# Patient Record
Sex: Female | Born: 1970 | Race: White | Hispanic: No | Marital: Married | Smoking: Former smoker
Health system: Southern US, Community
[De-identification: ages and names within clinical notes are randomized; demographics above are authoritative.]

## PROBLEM LIST (undated history)

## (undated) DIAGNOSIS — Z87440 Personal history of urinary (tract) infections: Secondary | ICD-10-CM

## (undated) DIAGNOSIS — G459 Transient cerebral ischemic attack, unspecified: Secondary | ICD-10-CM

## (undated) HISTORY — DX: Personal history of urinary (tract) infections: Z87.440

## (undated) HISTORY — DX: Transient cerebral ischemic attack, unspecified: G45.9

## (undated) HISTORY — PX: ASD REPAIR: SHX258

## (undated) HISTORY — PX: PATENT FORAMEN OVALE CLOSURE: SHX2181

---
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
## 1998-08-03 ENCOUNTER — Other Ambulatory Visit: Admission: RE | Admit: 1998-08-03 | Discharge: 1998-08-03 | Payer: Self-pay | Admitting: Obstetrics and Gynecology

## 1999-12-28 ENCOUNTER — Other Ambulatory Visit: Admission: RE | Admit: 1999-12-28 | Discharge: 1999-12-28 | Payer: Self-pay | Admitting: Obstetrics and Gynecology

## 2002-05-30 ENCOUNTER — Other Ambulatory Visit: Admission: RE | Admit: 2002-05-30 | Discharge: 2002-05-30 | Payer: Self-pay | Admitting: Obstetrics and Gynecology

## 2004-12-10 ENCOUNTER — Emergency Department: Payer: Self-pay | Admitting: Internal Medicine

## 2005-03-10 ENCOUNTER — Ambulatory Visit: Payer: Self-pay | Admitting: Internal Medicine

## 2005-03-18 ENCOUNTER — Ambulatory Visit: Payer: Self-pay | Admitting: Cardiology

## 2005-03-18 ENCOUNTER — Ambulatory Visit: Payer: Self-pay | Admitting: Internal Medicine

## 2005-03-25 ENCOUNTER — Ambulatory Visit: Payer: Self-pay | Admitting: Internal Medicine

## 2005-04-02 ENCOUNTER — Ambulatory Visit: Payer: Self-pay | Admitting: Internal Medicine

## 2005-04-06 ENCOUNTER — Ambulatory Visit: Payer: Self-pay | Admitting: Cardiology

## 2005-04-12 ENCOUNTER — Ambulatory Visit: Payer: Self-pay | Admitting: Cardiology

## 2005-04-19 ENCOUNTER — Ambulatory Visit: Payer: Self-pay | Admitting: Cardiology

## 2005-05-01 ENCOUNTER — Ambulatory Visit: Payer: Self-pay | Admitting: Cardiology

## 2005-05-17 ENCOUNTER — Ambulatory Visit: Payer: Self-pay | Admitting: Cardiology

## 2005-06-17 ENCOUNTER — Ambulatory Visit: Payer: Self-pay | Admitting: Cardiology

## 2005-07-15 ENCOUNTER — Ambulatory Visit: Payer: Self-pay | Admitting: Cardiology

## 2005-08-25 ENCOUNTER — Ambulatory Visit: Payer: Self-pay | Admitting: General Practice

## 2006-03-10 ENCOUNTER — Ambulatory Visit: Payer: Self-pay | Admitting: Cardiology

## 2012-10-25 ENCOUNTER — Emergency Department: Payer: Self-pay | Admitting: Emergency Medicine

## 2014-05-22 ENCOUNTER — Other Ambulatory Visit: Payer: Self-pay | Admitting: Obstetrics and Gynecology

## 2014-05-23 ENCOUNTER — Other Ambulatory Visit: Payer: Self-pay | Admitting: Obstetrics and Gynecology

## 2014-05-23 DIAGNOSIS — R928 Other abnormal and inconclusive findings on diagnostic imaging of breast: Secondary | ICD-10-CM

## 2014-06-02 ENCOUNTER — Ambulatory Visit
Admission: RE | Admit: 2014-06-02 | Discharge: 2014-06-02 | Disposition: A | Discharge status: Home / Self Care | Payer: BC Managed Care – PPO | Source: Ambulatory Visit | Attending: Obstetrics and Gynecology | Admitting: Obstetrics and Gynecology

## 2014-06-02 DIAGNOSIS — R928 Other abnormal and inconclusive findings on diagnostic imaging of breast: Secondary | ICD-10-CM

## 2014-09-12 HISTORY — PX: POLYPECTOMY: SHX149

## 2014-12-16 ENCOUNTER — Encounter: Payer: Self-pay | Admitting: *Deleted

## 2014-12-16 ENCOUNTER — Other Ambulatory Visit: Payer: Self-pay | Admitting: Obstetrics and Gynecology

## 2014-12-16 DIAGNOSIS — R921 Mammographic calcification found on diagnostic imaging of breast: Secondary | ICD-10-CM

## 2014-12-23 ENCOUNTER — Other Ambulatory Visit: Payer: Self-pay | Admitting: Obstetrics and Gynecology

## 2014-12-23 ENCOUNTER — Ambulatory Visit
Admission: RE | Admit: 2014-12-23 | Discharge: 2014-12-23 | Disposition: A | Discharge status: Home / Self Care | Payer: 59 | Source: Ambulatory Visit | Attending: Obstetrics and Gynecology | Admitting: Obstetrics and Gynecology

## 2014-12-23 DIAGNOSIS — N6489 Other specified disorders of breast: Secondary | ICD-10-CM

## 2014-12-23 DIAGNOSIS — R921 Mammographic calcification found on diagnostic imaging of breast: Secondary | ICD-10-CM

## 2015-03-26 ENCOUNTER — Encounter (INDEPENDENT_AMBULATORY_CARE_PROVIDER_SITE_OTHER): Payer: Self-pay

## 2015-03-26 ENCOUNTER — Encounter: Payer: Self-pay | Admitting: Primary Care

## 2015-03-26 ENCOUNTER — Ambulatory Visit (INDEPENDENT_AMBULATORY_CARE_PROVIDER_SITE_OTHER): Payer: 59 | Admitting: Primary Care

## 2015-03-26 VITALS — BP 128/82 | HR 82 | Temp 98.6°F | Ht 64.75 in | Wt 167.8 lb

## 2015-03-26 DIAGNOSIS — N393 Stress incontinence (female) (male): Secondary | ICD-10-CM | POA: Diagnosis not present

## 2015-03-26 NOTE — Assessment & Plan Note (Signed)
Mild. Intermittent. Present since age 44 since birth of child.  Does not interfere with daily life.

## 2015-03-26 NOTE — Progress Notes (Signed)
Pre visit review using our clinic review tool, if applicable. No additional management support is needed unless otherwise documented below in the visit note. 

## 2015-03-26 NOTE — Progress Notes (Signed)
   Subjective:    Patient ID: Angela Snow, female    DOB: [birth year], 44 y.o.   MRN: [id number]  HPI  Angela Snow is a 44 year old female who presents today to establish care and discuss the problems mentioned below. Will obtain old records.  1) Urine incontinency: Present since age 84. Intermittent. Stress type that occurs with sneezing, jumping. Does not impair daily life.  2) TIA: Atrial eptal defect that was discovered in 2006 and repaired later that year. Denies chest pain, shortness of breath, headaches.  Review of Systems  Eyes: Negative for visual disturbance.  Respiratory: Negative for shortness of breath.   Cardiovascular: Negative for chest pain.  Genitourinary: Negative for difficulty urinating.  Neurological: Negative for dizziness and headaches.       Past Medical History  Diagnosis Date  . History of UTI   . TIA (transient ischemic attack)     atrial septal defect    History   Social History  . Marital Status: Married    Spouse Name: N/A  . Number of Children: N/A  . Years of Education: N/A   Occupational History  . Not on file.   Social History Main Topics  . Smoking status: Never Smoker   . Smokeless tobacco: Not on file  . Alcohol Use: No  . Drug Use: Not on file  . Sexual Activity: Not on file   Other Topics Concern  . Not on file   Social History Narrative   Married.   2 children.   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   Enjoys spending time with family.       Past Surgical History  Procedure Laterality Date  . Patent foramen ovale closure    . Asd repair    . Bladder surgery  1976  . Kidney surgery  1976  . Polypectomy  2016    uterine    Family History  Problem Relation Age of Onset  . Colon cancer Maternal Grandfather   . Hypertension Father   . Hyperlipidemia Father   . Hyperlipidemia Mother     Allergies  Allergen Reactions  . Penicillins Rash    No current outpatient prescriptions on  file prior to visit.   No current facility-administered medications on file prior to visit.    BP 128/82 mmHg  Pulse 82  Temp(Src) 98.6 F (37 C) (Oral)  Ht 5' 4.75" (1.645 m)  Wt 167 lb 12.8 oz (76.114 kg)  BMI 28.13 kg/m2  SpO2 97%  LMP 03/05/2015    Objective:   Physical Exam  Constitutional: She is oriented to person, place, and time. She appears well-nourished.  Cardiovascular: Normal rate and regular rhythm.   Pulmonary/Chest: Effort normal and breath sounds normal.  Neurological: She is alert and oriented to person, place, and time.  Skin: Skin is warm and dry.          Assessment & Plan:

## 2015-03-26 NOTE — Patient Instructions (Signed)
Please schedule a physical with me in the next 3 months. You will also schedule a lab only appointment one week prior. We will discuss your lab results during your physical.  It was a pleasure to meet you today! Please don't hesitate to call me with any questions. Welcome to [facility]!

## 2015-06-11 ENCOUNTER — Other Ambulatory Visit: Payer: Self-pay | Admitting: Primary Care

## 2015-06-11 DIAGNOSIS — Z0000 Encounter for general adult medical examination without abnormal findings: Secondary | ICD-10-CM

## 2015-06-19 ENCOUNTER — Other Ambulatory Visit (INDEPENDENT_AMBULATORY_CARE_PROVIDER_SITE_OTHER): Payer: 59

## 2015-06-19 DIAGNOSIS — Z0000 Encounter for general adult medical examination without abnormal findings: Secondary | ICD-10-CM

## 2015-06-19 LAB — COMPREHENSIVE METABOLIC PANEL
ALK PHOS: 50 U/L (ref 39–117)
ALT: 8 U/L (ref 0–35)
AST: 11 U/L (ref 0–37)
Albumin: 3.9 g/dL (ref 3.5–5.2)
BUN: 12 mg/dL (ref 6–23)
CO2: 29 meq/L (ref 19–32)
Calcium: 8.7 mg/dL (ref 8.4–10.5)
Chloride: 104 mEq/L (ref 96–112)
Creatinine, Ser: 0.73 mg/dL (ref 0.40–1.20)
GFR: 92.16 mL/min (ref >60.00)
GLUCOSE: 97 mg/dL (ref 70–99)
POTASSIUM: 3.9 meq/L (ref 3.5–5.1)
SODIUM: 139 meq/L (ref 135–145)
TOTAL PROTEIN: 6.8 g/dL (ref 6.0–8.3)
Total Bilirubin: 0.3 mg/dL (ref 0.2–1.2)

## 2015-06-19 LAB — LIPID PANEL
Cholesterol: 199 mg/dL (ref 0–200)
HDL: 70.9 mg/dL (ref >39.00)
LDL CALC: 118 mg/dL — AB (ref 0–99)
NONHDL: 128.29
Total CHOL/HDL Ratio: 3
Triglycerides: 53 mg/dL (ref 0.0–149.0)
VLDL: 10.6 mg/dL (ref 0.0–40.0)

## 2015-06-19 LAB — TSH: TSH: 1.65 u[IU]/mL (ref 0.35–4.50)

## 2015-06-19 LAB — VITAMIN D 25 HYDROXY (VIT D DEFICIENCY, FRACTURES): VITD: 29.75 ng/mL — AB (ref 30.00–100.00)

## 2015-06-26 ENCOUNTER — Encounter: Payer: Self-pay | Admitting: Primary Care

## 2015-06-26 ENCOUNTER — Ambulatory Visit (INDEPENDENT_AMBULATORY_CARE_PROVIDER_SITE_OTHER): Payer: 59 | Admitting: Primary Care

## 2015-06-26 ENCOUNTER — Encounter: Payer: 59 | Admitting: Primary Care

## 2015-06-26 VITALS — BP 116/70 | HR 87 | Temp 98.6°F | Ht 65.0 in | Wt 168.8 lb

## 2015-06-26 DIAGNOSIS — E559 Vitamin D deficiency, unspecified: Secondary | ICD-10-CM | POA: Diagnosis not present

## 2015-06-26 DIAGNOSIS — Z0000 Encounter for general adult medical examination without abnormal findings: Secondary | ICD-10-CM | POA: Diagnosis not present

## 2015-06-26 NOTE — Patient Instructions (Signed)
Start taking Vitamin D 2000 units daily. This may be purchased over the counter.  Start exercising. You should be getting 1 hour of moderate intensity exercise 5 days weekly.  Limit fast food, fried foods, and sweets.  Follow up in 1 year for repeat physical.  It was a pleasure to see you today!

## 2015-06-26 NOTE — Assessment & Plan Note (Signed)
Tetanus and flu UTD. Exam unremarkable. Labs with low vitamin D levels, otherwise unremarkable. Discussed healthy diet and exercise, limit fast food, fried foods, start exercising. Follow up in 1 year for repeat physical.

## 2015-06-26 NOTE — Progress Notes (Signed)
Pre visit review using our clinic review tool, if applicable. No additional management support is needed unless otherwise documented below in the visit note. 

## 2015-06-26 NOTE — Assessment & Plan Note (Signed)
Level of 29. Start 2000 units OTC daily.

## 2015-06-26 NOTE — Progress Notes (Signed)
Subjective:    Patient ID: Angela Snow, female    DOB: January 10, 1971, 44 y.o.   MRN: [id number]  HPI  Angela Snow is a 44 year old female who presents today for complete physical.  Immunizations: -Tetanus: Completed in 2007. -Influenza: Completed at work this season.   Diet: She endorses a poor diet. Breakfast: Yogurt and fruit, smoothie Lunch: [place] sandwich, cheeseburger fries Dinner: Fast food, sometimes home cooked meals with meat and veggies Snacks: Ice cream, chips, candy bars Beverages: Water, coffee with milk Exercise: Active at work but does not routinely exercise. Eye exam: Completed years ago. Denies changes in vision. Dental exam: Completed in July Pap Smear: Completed 1 year ago. Mammogram: Completed 1 year ago. Dense breast tissue. Due now, she will call and schedule.   Review of Systems  HENT: Negative for rhinorrhea.   Respiratory: Negative for cough and shortness of breath.   Cardiovascular: Negative for chest pain.  Gastrointestinal: Negative for diarrhea and constipation.  Genitourinary: Negative for difficulty urinating.       Regular periods  Musculoskeletal: Negative for myalgias and arthralgias.  Skin: Negative for rash.  Allergic/Immunologic: Negative for environmental allergies.  Neurological: Negative for dizziness, numbness and headaches.  Psychiatric/Behavioral:       Denies concerns for anxiety or depression       Past Medical History  Diagnosis Date  . History of UTI   . TIA (transient ischemic attack)     atrial septal defect    Social History   Social History  . Marital Status: Married    Spouse Name: N/A  . Number of Children: N/A  . Years of Education: N/A   Occupational History  . Not on file.   Social History Main Topics  . Smoking status: Never Smoker   . Smokeless tobacco: Not on file  . Alcohol Use: No  . Drug Use: Not on file  . Sexual Activity: Not on file   Other Topics Concern  . Not on file   Social  History Narrative   Married.   2 children.   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   Enjoys spending time with family.       Past Surgical History  Procedure Laterality Date  . Patent foramen ovale closure    . Asd repair    . Bladder surgery  1976  . Kidney surgery  1976  . Polypectomy  2016    uterine    Family History  Problem Relation Age of Onset  . Colon cancer Maternal Grandfather   . Hypertension Father   . Hyperlipidemia Father   . Hyperlipidemia Mother     Allergies  Allergen Reactions  . Penicillins Rash    No current outpatient prescriptions on file prior to visit.   No current facility-administered medications on file prior to visit.    BP 116/70 mmHg  Pulse 87  Temp(Src) 98.6 F (37 C) (Oral)  Ht 5\' 5"  (1.651 m)  Wt 168 lb 12.8 oz (76.567 kg)  BMI 28.09 kg/m2  SpO2 97%    Objective:   Physical Exam  Constitutional: She is oriented to person, place, and time. She appears well-nourished.  HENT:  Right Ear: Tympanic membrane and ear canal normal.  Left Ear: Tympanic membrane and ear canal normal.  Nose: Nose normal.  Mouth/Throat: Oropharynx is clear and moist.  Eyes: Conjunctivae and EOM are normal. Pupils are equal, round, and reactive to light.  Neck: Neck supple. No  thyromegaly present.  Cardiovascular: Normal rate and regular rhythm.   Pulmonary/Chest: Effort normal and breath sounds normal.  Abdominal: Soft. Bowel sounds are normal. There is no tenderness.  Musculoskeletal: Normal range of motion.  Lymphadenopathy:    She has no cervical adenopathy.  Neurological: She is alert and oriented to person, place, and time. She has normal reflexes. No cranial nerve deficit.  Skin: Skin is warm and dry.  Psychiatric: She has a normal mood and affect.          Assessment & Plan:

## 2016-04-25 ENCOUNTER — Ambulatory Visit: Payer: Self-pay | Admitting: Physician Assistant

## 2016-04-25 ENCOUNTER — Telehealth: Payer: 59 | Admitting: Family Medicine

## 2016-04-25 DIAGNOSIS — J0120 Acute ethmoidal sinusitis, unspecified: Secondary | ICD-10-CM

## 2016-04-25 MED ORDER — AZITHROMYCIN 250 MG PO TABS: ORAL_TABLET | ORAL | 0 refills | Status: DC

## 2016-04-25 NOTE — Progress Notes (Signed)
We are sorry that you are not feeling well.  Here is how we plan to help!  Based on what you have shared with me it looks like you have sinusitis.  Sinusitis is inflammation and infection in the sinus cavities of the head.  Based on your presentation I believe you most likely have Acute Viral Sinusitis.This is an infection most likely caused by a virus. There is not specific treatment for viral sinusitis other than to help you with the symptoms until the infection runs its course.  You may use an oral decongestant such as Mucinex D or if you have glaucoma or high blood pressure use plain Mucinex. Saline nasal spray help and can safely be used as often as needed for congestion, I have prescribed: Z pack. Take as directed. Increase daily fluids  Some authorities believe that zinc sprays or the use of Echinacea may shorten the course of your symptoms.  Sinus infections are not as easily transmitted as other respiratory infection, however we still recommend that you avoid close contact with loved ones, especially the very young and elderly.  Remember to wash your hands thoroughly throughout the day as this is the number one way to prevent the spread of infection!  Home Care:  Only take medications as instructed by your medical team.  Complete the entire course of an antibiotic.  Do not take these medications with alcohol.  A steam or ultrasonic humidifier can help congestion.  You can place a towel over your head and breathe in the steam from hot water coming from a faucet.  Avoid close contacts especially the very young and the elderly.  Cover your mouth when you cough or sneeze.  Always remember to wash your hands.  Get Help Right Away If:  You develop worsening fever or sinus pain.  You develop a severe head ache or visual changes.  Your symptoms persist after you have completed your treatment plan.  Make sure you  Understand these instructions.  Will watch your condition.  Will  get help right away if you are not doing well or get worse.  Your e-visit answers were reviewed by a board certified advanced clinical practitioner to complete your personal care plan.  Depending on the condition, your plan could have included both over the counter or prescription medications.  If there is a problem please reply  once you have received a response from your provider.  Your safety is important to [place].  If you have drug allergies check your prescription carefully.    You can use MyChart to ask questions about today's visit, request a non-urgent call back, or ask for a work or school excuse for 24 hours related to this e-Visit. If it has been greater than 24 hours you will need to follow up with your provider, or enter a new e-Visit to address those concerns.  You will get an e-mail in the next two days asking about your experience.  I hope that your e-visit has been valuable and will speed your recovery. Thank you for using e-visits.

## 2016-05-13 ENCOUNTER — Other Ambulatory Visit: Payer: Self-pay | Admitting: Obstetrics and Gynecology

## 2016-05-13 DIAGNOSIS — Z1231 Encounter for screening mammogram for malignant neoplasm of breast: Secondary | ICD-10-CM

## 2016-05-31 ENCOUNTER — Ambulatory Visit
Admission: RE | Admit: 2016-05-31 | Discharge: 2016-05-31 | Disposition: A | Discharge status: Home / Self Care | Payer: 59 | Source: Ambulatory Visit | Attending: Obstetrics and Gynecology | Admitting: Obstetrics and Gynecology

## 2016-05-31 DIAGNOSIS — Z1231 Encounter for screening mammogram for malignant neoplasm of breast: Secondary | ICD-10-CM | POA: Diagnosis not present

## 2016-06-28 ENCOUNTER — Encounter: Payer: Self-pay | Admitting: Primary Care

## 2016-06-28 ENCOUNTER — Ambulatory Visit (INDEPENDENT_AMBULATORY_CARE_PROVIDER_SITE_OTHER): Payer: 59 | Admitting: Primary Care

## 2016-06-28 VITALS — BP 102/77 | HR 77 | Ht 64.75 in | Wt 157.0 lb

## 2016-06-28 DIAGNOSIS — Z23 Encounter for immunization: Secondary | ICD-10-CM

## 2016-06-28 DIAGNOSIS — Z0000 Encounter for general adult medical examination without abnormal findings: Secondary | ICD-10-CM

## 2016-06-28 DIAGNOSIS — E559 Vitamin D deficiency, unspecified: Secondary | ICD-10-CM | POA: Diagnosis not present

## 2016-06-28 LAB — LIPID PANEL
CHOL/HDL RATIO: 3
Cholesterol: 218 mg/dL — ABNORMAL HIGH (ref 0–200)
HDL: 73.5 mg/dL (ref >39.00)
LDL CALC: 133 mg/dL — AB (ref 0–99)
NonHDL: 144.32
TRIGLYCERIDES: 56 mg/dL (ref 0.0–149.0)
VLDL: 11.2 mg/dL (ref 0.0–40.0)

## 2016-06-28 LAB — COMPREHENSIVE METABOLIC PANEL
ALT: 9 U/L (ref 0–35)
AST: 13 U/L (ref 0–37)
Albumin: 4.1 g/dL (ref 3.5–5.2)
Alkaline Phosphatase: 59 U/L (ref 39–117)
BUN: 15 mg/dL (ref 6–23)
CO2: 29 meq/L (ref 19–32)
CREATININE: 0.73 mg/dL (ref 0.40–1.20)
Calcium: 9.2 mg/dL (ref 8.4–10.5)
Chloride: 106 mEq/L (ref 96–112)
GFR: 91.72 mL/min (ref >60.00)
Glucose, Bld: 92 mg/dL (ref 70–99)
POTASSIUM: 3.8 meq/L (ref 3.5–5.1)
Sodium: 141 mEq/L (ref 135–145)
Total Bilirubin: 0.4 mg/dL (ref 0.2–1.2)
Total Protein: 7.1 g/dL (ref 6.0–8.3)

## 2016-06-28 LAB — VITAMIN D 25 HYDROXY (VIT D DEFICIENCY, FRACTURES): VITD: 33.8 ng/mL (ref 30.00–100.00)

## 2016-06-28 NOTE — Assessment & Plan Note (Signed)
Has not started Vitamin D. Recheck levels today.

## 2016-06-28 NOTE — Patient Instructions (Signed)
Complete lab work prior to leaving today. I will notify you of your results once received.   You were provided with a tetanus vaccination which will cover you for 10 years.   Continue your efforts towards a healthy diet. Avoid the drive through and junk food at work, it's just not worth it!  Start exercising. You should be getting 150 minutes of moderate intensity exercise weekly.  Ensure you are consuming 64 ounces of water daily.  Follow up in 1 year for repeat physical or sooner if needed.  It was a pleasure to see you today!

## 2016-06-28 NOTE — Assessment & Plan Note (Signed)
Tetanus due, provided today. Influenza UTD. Pap UTD, follows with GYN. Discussed the importance of a healthy diet and regular exercise in order for weight loss, and to reduce the risk of other medical diseases. Commended her on her weight loss, encouraged her to continue. Exam unremarkable. Labs pending. Follow up in 1 year for repeat physical.

## 2016-06-28 NOTE — Progress Notes (Signed)
Subjective:    Patient ID: Angela Snow, female    DOB: May 12, 1971, 45 y.o.   MRN: [id number]  HPI  Angela Snow is a 45 year old female who presents today for complete physical.  Immunizations: -Tetanus: Completed in 2007, due. -Influenza: Completed September 2017   Diet: She endorses a fair diet.  Breakfast: Yogurt, banana, peanut butter Lunch: Sandwich, salad, fruit Dinner: Fast food, salads, soups Snacks: Yogurt Desserts: Several times weekly Beverages: Water, coffee, occasional sodas  Exercise: She does not currently exercise Eye exam: Completed in 2017 Dental exam: Completes semi-annually Pap Smear: UTD, follows with GYN Mammogram: Completed in September 2017.  Wt Readings from Last 3 Encounters:  06/28/16 157 lb (71.2 kg)  06/26/15 168 lb 12.8 oz (76.6 kg)  03/26/15 167 lb 12.8 oz (76.1 kg)     Review of Systems  Constitutional: Negative for unexpected weight change.  HENT: Negative for rhinorrhea.   Respiratory: Negative for cough and shortness of breath.   Cardiovascular: Negative for chest pain.  Gastrointestinal: Negative for constipation and diarrhea.  Genitourinary: Negative for difficulty urinating and menstrual problem.  Musculoskeletal: Negative for arthralgias and myalgias.  Skin: Negative for rash.  Allergic/Immunologic: Negative for environmental allergies.  Neurological: Negative for dizziness, numbness and headaches.  Psychiatric/Behavioral:       Denies concerns for anxiety or depression.       Past Medical History:  Diagnosis Date  . History of UTI   . TIA (transient ischemic attack)    atrial septal defect     Social History   Social History  . Marital status: Married    Spouse name: N/A  . Number of children: N/A  . Years of education: N/A   Occupational History  . Not on file.   Social History Main Topics  . Smoking status: Never Smoker  . Smokeless tobacco: Not on file  . Alcohol use No  . Drug use: Unknown  .  Sexual activity: Not on file   Other Topics Concern  . Not on file   Social History Narrative   Married.   2 children.   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   Enjoys spending time with family.       Past Surgical History:  Procedure Laterality Date  . ASD REPAIR    . BLADDER SURGERY  1976  . KIDNEY SURGERY  1976  . PATENT FORAMEN OVALE CLOSURE    . POLYPECTOMY  2016   uterine    Family History  Problem Relation Age of Onset  . Colon cancer Maternal Grandfather   . Hypertension Father   . Hyperlipidemia Father   . Hyperlipidemia Mother     Allergies  Allergen Reactions  . Penicillins Rash    No current outpatient prescriptions on file prior to visit.   No current facility-administered medications on file prior to visit.     BP 102/77   Pulse 77   Ht 5' 4.75" (1.645 m)   Wt 157 lb (71.2 kg)   LMP 05/11/2016 Comment: IRREGULAR  SpO2 98%   BMI 26.33 kg/m    Objective:   Physical Exam  Constitutional: She is oriented to person, place, and time. She appears well-nourished.  HENT:  Right Ear: Tympanic membrane and ear canal normal.  Left Ear: Tympanic membrane and ear canal normal.  Nose: Nose normal.  Mouth/Throat: Oropharynx is clear and moist.  Eyes: Conjunctivae and EOM are normal. Pupils are equal, round, and reactive to  light.  Neck: Neck supple. No thyromegaly present.  Cardiovascular: Normal rate and regular rhythm.   No murmur heard. Pulmonary/Chest: Effort normal and breath sounds normal. She has no rales.  Abdominal: Soft. Bowel sounds are normal. There is no tenderness.  Musculoskeletal: Normal range of motion.  Lymphadenopathy:    She has no cervical adenopathy.  Neurological: She is alert and oriented to person, place, and time. She has normal reflexes. No cranial nerve deficit.  Skin: Skin is warm and dry. No rash noted.  Psychiatric: She has a normal mood and affect.          Assessment & Plan:

## 2016-06-28 NOTE — Addendum Note (Signed)
Addended by: Inocencio Homes on: 06/28/2016 08:55 AM   Modules accepted: Orders

## 2016-07-13 DIAGNOSIS — Z130 Encounter for screening for diseases of the blood and blood-forming organs and certain disorders involving the immune mechanism: Secondary | ICD-10-CM | POA: Diagnosis not present

## 2016-07-13 DIAGNOSIS — Z01419 Encounter for gynecological examination (general) (routine) without abnormal findings: Secondary | ICD-10-CM | POA: Diagnosis not present

## 2016-07-13 DIAGNOSIS — Z6827 Body mass index (BMI) 27.0-27.9, adult: Secondary | ICD-10-CM | POA: Diagnosis not present

## 2016-07-13 DIAGNOSIS — H5213 Myopia, bilateral: Secondary | ICD-10-CM | POA: Diagnosis not present

## 2016-07-13 DIAGNOSIS — Z1389 Encounter for screening for other disorder: Secondary | ICD-10-CM | POA: Diagnosis not present

## 2016-07-13 DIAGNOSIS — N911 Secondary amenorrhea: Secondary | ICD-10-CM | POA: Diagnosis not present

## 2017-05-09 ENCOUNTER — Other Ambulatory Visit: Payer: Self-pay | Admitting: Obstetrics and Gynecology

## 2017-05-09 DIAGNOSIS — Z1231 Encounter for screening mammogram for malignant neoplasm of breast: Secondary | ICD-10-CM

## 2017-06-01 ENCOUNTER — Ambulatory Visit
Admission: RE | Admit: 2017-06-01 | Discharge: 2017-06-01 | Disposition: A | Discharge status: Home / Self Care | Payer: 59 | Source: Ambulatory Visit | Attending: Obstetrics and Gynecology | Admitting: Obstetrics and Gynecology

## 2017-06-01 DIAGNOSIS — Z1231 Encounter for screening mammogram for malignant neoplasm of breast: Secondary | ICD-10-CM

## 2017-06-30 ENCOUNTER — Encounter: Payer: Self-pay | Admitting: Primary Care

## 2017-07-05 ENCOUNTER — Encounter: Payer: Self-pay | Admitting: Primary Care

## 2017-07-05 ENCOUNTER — Ambulatory Visit (INDEPENDENT_AMBULATORY_CARE_PROVIDER_SITE_OTHER): Payer: 59 | Admitting: Primary Care

## 2017-07-05 VITALS — BP 118/72 | HR 78 | Temp 98.6°F | Ht 64.75 in | Wt 178.1 lb

## 2017-07-05 DIAGNOSIS — Z0000 Encounter for general adult medical examination without abnormal findings: Secondary | ICD-10-CM

## 2017-07-05 DIAGNOSIS — E559 Vitamin D deficiency, unspecified: Secondary | ICD-10-CM | POA: Diagnosis not present

## 2017-07-05 DIAGNOSIS — N393 Stress incontinence (female) (male): Secondary | ICD-10-CM | POA: Diagnosis not present

## 2017-07-05 LAB — LIPID PANEL
CHOLESTEROL: 224 mg/dL — AB (ref 0–200)
HDL: 76.9 mg/dL (ref >39.00)
LDL Cholesterol: 138 mg/dL — ABNORMAL HIGH (ref 0–99)
NONHDL: 146.74
TRIGLYCERIDES: 46 mg/dL (ref 0.0–149.0)
Total CHOL/HDL Ratio: 3
VLDL: 9.2 mg/dL (ref 0.0–40.0)

## 2017-07-05 LAB — COMPREHENSIVE METABOLIC PANEL
ALK PHOS: 66 U/L (ref 39–117)
ALT: 11 U/L (ref 0–35)
AST: 15 U/L (ref 0–37)
Albumin: 4.2 g/dL (ref 3.5–5.2)
BILIRUBIN TOTAL: 0.4 mg/dL (ref 0.2–1.2)
BUN: 16 mg/dL (ref 6–23)
CALCIUM: 9.6 mg/dL (ref 8.4–10.5)
CO2: 29 mEq/L (ref 19–32)
Chloride: 103 mEq/L (ref 96–112)
Creatinine, Ser: 0.78 mg/dL (ref 0.40–1.20)
GFR: 84.59 mL/min (ref >60.00)
GLUCOSE: 94 mg/dL (ref 70–99)
POTASSIUM: 4.1 meq/L (ref 3.5–5.1)
SODIUM: 139 meq/L (ref 135–145)
Total Protein: 7 g/dL (ref 6.0–8.3)

## 2017-07-05 LAB — HEMOGLOBIN A1C: Hgb A1c MFr Bld: 5.4 % (ref 4.6–6.5)

## 2017-07-05 LAB — VITAMIN D 25 HYDROXY (VIT D DEFICIENCY, FRACTURES): VITD: 25.03 ng/mL — ABNORMAL LOW (ref 30.00–100.00)

## 2017-07-05 NOTE — Progress Notes (Signed)
Subjective:    Patient ID: Angela Snow, female    DOB: 08-Jan-1971, 46 y.o.   MRN: [id number]  HPI  Angela Snow is a 46 year old female who presents today for complete physical.  Immunizations: -Tetanus: Completed in 2017 -Influenza: Completed this season   Diet: She endorses a poor diet. Breakfast: Yogurt, fruit Lunch: Fast food, restaurants Dinner: [employer], restaurants, pasta   Snacks: Candy Desserts: Daily Beverages: Water, coffee  Exercise: She recently started walking 30 minutes daily. Eye exam: Completed one year ago. Dental exam: Completes semi-annually Pap Smear: UTD following with GYN. Mammogram: Completed in September 2018   Review of Systems  Constitutional: Negative for unexpected weight change.  HENT: Negative for rhinorrhea.   Respiratory: Negative for cough and shortness of breath.   Cardiovascular: Negative for chest pain.  Gastrointestinal: Negative for constipation and diarrhea.  Genitourinary: Negative for difficulty urinating.  Musculoskeletal: Negative for arthralgias and myalgias.  Skin: Negative for rash.  Allergic/Immunologic: Negative for environmental allergies.  Neurological: Negative for dizziness, numbness and headaches.  Psychiatric/Behavioral:       Denies concerns for anxiety and depression       Past Medical History:  Diagnosis Date  . History of UTI   . TIA (transient ischemic attack)    atrial septal defect     Social History   Social History  . Marital status: Married    Spouse name: N/A  . Number of children: N/A  . Years of education: N/A   Occupational History  . Not on file.   Social History Main Topics  . Smoking status: Never Smoker  . Smokeless tobacco: Never Used  . Alcohol use No  . Drug use: Unknown  . Sexual activity: Not on file   Other Topics Concern  . Not on file   Social History Narrative   Married.   2 children.   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   Enjoys  spending time with family.       Past Surgical History:  Procedure Laterality Date  . ASD REPAIR    . BLADDER SURGERY  1976  . KIDNEY SURGERY  1976  . PATENT FORAMEN OVALE CLOSURE    . POLYPECTOMY  2016   uterine    Family History  Problem Relation Age of Onset  . Colon cancer Maternal Grandfather   . Hypertension Father   . Hyperlipidemia Father   . Hyperlipidemia Mother   . Breast cancer Neg Hx     Allergies  Allergen Reactions  . Penicillins Rash    No current outpatient prescriptions on file prior to visit.   No current facility-administered medications on file prior to visit.     BP 118/72   Pulse 78   Temp 98.6 F (37 C) (Oral)   Ht 5' 4.75" (1.645 m)   Wt 178 lb 1.9 oz (80.8 kg)   LMP 06/17/2017   SpO2 98%   BMI 29.87 kg/m    Objective:   Physical Exam  Constitutional: She is oriented to person, place, and time. She appears well-nourished.  HENT:  Right Ear: Tympanic membrane and ear canal normal.  Left Ear: Tympanic membrane and ear canal normal.  Nose: Nose normal.  Mouth/Throat: Oropharynx is clear and moist.  Eyes: Pupils are equal, round, and reactive to light. Conjunctivae and EOM are normal.  Neck: Neck supple. No thyromegaly present.  Cardiovascular: Normal rate and regular rhythm.   No murmur heard. Pulmonary/Chest: Effort  normal and breath sounds normal. She has no rales.  Abdominal: Soft. Bowel sounds are normal. There is no tenderness.  Musculoskeletal: Normal range of motion.  Lymphadenopathy:    She has no cervical adenopathy.  Neurological: She is alert and oriented to person, place, and time. She has normal reflexes. No cranial nerve deficit.  Skin: Skin is warm and dry. No rash noted.  Psychiatric: She has a normal mood and affect.          Assessment & Plan:

## 2017-07-05 NOTE — Assessment & Plan Note (Signed)
About the same, slightly worse since weight gain.  Overall manageable.

## 2017-07-05 NOTE — Assessment & Plan Note (Signed)
Immunizations UTD. Pap and Mammogram UTD, following with GYN. Discussed the importance of a healthy diet and regular exercise in order for weight loss, and to reduce the risk of other medical problems. Exam unremarkable. Labs pending. Follow up in 1 year.

## 2017-07-05 NOTE — Patient Instructions (Signed)
Complete lab work prior to leaving today. I will notify you of your results once received.   Continue exercising. You should be getting 150 minutes of moderate intensity exercise weekly.  It's important to improve your diet by reducing consumption of fast food, fried food, processed snack foods, sugary drinks. Increase consumption of fresh vegetables and fruits, whole grains, water.  Ensure you are drinking 64 ounces of water daily.  Follow up in 1 year for your annual exam or sooner if needed.  It was a pleasure to see you today!

## 2017-07-05 NOTE — Assessment & Plan Note (Signed)
Labs pending today.  

## 2017-07-12 ENCOUNTER — Encounter: Payer: Self-pay | Admitting: Primary Care

## 2017-07-28 DIAGNOSIS — Z124 Encounter for screening for malignant neoplasm of cervix: Secondary | ICD-10-CM | POA: Diagnosis not present

## 2017-07-28 DIAGNOSIS — Z1151 Encounter for screening for human papillomavirus (HPV): Secondary | ICD-10-CM | POA: Diagnosis not present

## 2017-07-28 DIAGNOSIS — Z01419 Encounter for gynecological examination (general) (routine) without abnormal findings: Secondary | ICD-10-CM | POA: Diagnosis not present

## 2017-07-28 DIAGNOSIS — Z1389 Encounter for screening for other disorder: Secondary | ICD-10-CM | POA: Diagnosis not present

## 2017-07-28 DIAGNOSIS — N393 Stress incontinence (female) (male): Secondary | ICD-10-CM | POA: Diagnosis not present

## 2017-07-28 DIAGNOSIS — Z130 Encounter for screening for diseases of the blood and blood-forming organs and certain disorders involving the immune mechanism: Secondary | ICD-10-CM | POA: Diagnosis not present

## 2017-11-03 DIAGNOSIS — N6019 Diffuse cystic mastopathy of unspecified breast: Secondary | ICD-10-CM | POA: Diagnosis not present

## 2018-05-03 ENCOUNTER — Other Ambulatory Visit: Payer: Self-pay | Admitting: Obstetrics and Gynecology

## 2018-05-03 DIAGNOSIS — Z1231 Encounter for screening mammogram for malignant neoplasm of breast: Secondary | ICD-10-CM

## 2018-06-14 ENCOUNTER — Ambulatory Visit
Admission: RE | Admit: 2018-06-14 | Discharge: 2018-06-14 | Disposition: A | Discharge status: Home / Self Care | Payer: 59 | Source: Ambulatory Visit | Attending: Obstetrics and Gynecology | Admitting: Obstetrics and Gynecology

## 2018-06-14 DIAGNOSIS — Z1231 Encounter for screening mammogram for malignant neoplasm of breast: Secondary | ICD-10-CM | POA: Diagnosis not present

## 2018-07-09 ENCOUNTER — Other Ambulatory Visit: Payer: Self-pay | Admitting: Primary Care

## 2018-07-09 DIAGNOSIS — E559 Vitamin D deficiency, unspecified: Secondary | ICD-10-CM

## 2018-07-09 DIAGNOSIS — Z0000 Encounter for general adult medical examination without abnormal findings: Secondary | ICD-10-CM

## 2018-07-17 ENCOUNTER — Other Ambulatory Visit (INDEPENDENT_AMBULATORY_CARE_PROVIDER_SITE_OTHER): Payer: 59

## 2018-07-17 DIAGNOSIS — Z0000 Encounter for general adult medical examination without abnormal findings: Secondary | ICD-10-CM

## 2018-07-17 DIAGNOSIS — E559 Vitamin D deficiency, unspecified: Secondary | ICD-10-CM

## 2018-07-17 LAB — LIPID PANEL
CHOL/HDL RATIO: 3
Cholesterol: 223 mg/dL — ABNORMAL HIGH (ref 0–200)
HDL: 75.4 mg/dL (ref >39.00)
LDL Cholesterol: 137 mg/dL — ABNORMAL HIGH (ref 0–99)
NonHDL: 147.33
TRIGLYCERIDES: 53 mg/dL (ref 0.0–149.0)
VLDL: 10.6 mg/dL (ref 0.0–40.0)

## 2018-07-17 LAB — COMPREHENSIVE METABOLIC PANEL
ALT: 13 U/L (ref 0–35)
AST: 15 U/L (ref 0–37)
Albumin: 4.4 g/dL (ref 3.5–5.2)
Alkaline Phosphatase: 67 U/L (ref 39–117)
BUN: 17 mg/dL (ref 6–23)
CO2: 29 meq/L (ref 19–32)
CREATININE: 0.85 mg/dL (ref 0.40–1.20)
Calcium: 9.5 mg/dL (ref 8.4–10.5)
Chloride: 104 mEq/L (ref 96–112)
GFR: 76.25 mL/min (ref >60.00)
Glucose, Bld: 93 mg/dL (ref 70–99)
Potassium: 3.8 mEq/L (ref 3.5–5.1)
Sodium: 141 mEq/L (ref 135–145)
Total Bilirubin: 0.4 mg/dL (ref 0.2–1.2)
Total Protein: 7.3 g/dL (ref 6.0–8.3)

## 2018-07-17 LAB — VITAMIN D 25 HYDROXY (VIT D DEFICIENCY, FRACTURES): VITD: 32.99 ng/mL (ref 30.00–100.00)

## 2018-07-20 ENCOUNTER — Encounter: Payer: Self-pay | Admitting: Primary Care

## 2018-07-20 ENCOUNTER — Ambulatory Visit (INDEPENDENT_AMBULATORY_CARE_PROVIDER_SITE_OTHER): Payer: 59 | Admitting: Primary Care

## 2018-07-20 VITALS — BP 122/72 | HR 78 | Temp 98.2°F | Ht 64.5 in | Wt 184.0 lb

## 2018-07-20 DIAGNOSIS — E559 Vitamin D deficiency, unspecified: Secondary | ICD-10-CM

## 2018-07-20 DIAGNOSIS — Z0000 Encounter for general adult medical examination without abnormal findings: Secondary | ICD-10-CM | POA: Diagnosis not present

## 2018-07-20 NOTE — Assessment & Plan Note (Signed)
Not taking vitamin D, recommended she resume 1000 units. Recent D level normal, on the lower end of normal.

## 2018-07-20 NOTE — Patient Instructions (Signed)
Start vitamin D 1000 units once daily. Also a multivitamin.   Continue exercising. You should be getting 150 minutes of moderate intensity exercise weekly.  It's important to improve your diet by reducing consumption of fast food, fried food, processed snack foods, sugary drinks. Increase consumption of fresh vegetables and fruits, whole grains, water.  Ensure you are drinking 64 ounces of water daily.  We will see you in one year for your annual exam or sooner if needed.  It was a pleasure to see you today!   Preventive Care 40-64 Years, Female Preventive care refers to lifestyle choices and visits with your health care provider that can promote health and wellness. What does preventive care include?  A yearly physical exam. This is also called an annual well check.  Dental exams once or twice a year.  Routine eye exams. Ask your health care provider how often you should have your eyes checked.  Personal lifestyle choices, including: ? Daily care of your teeth and gums. ? Regular physical activity. ? Eating a healthy diet. ? Avoiding tobacco and drug use. ? Limiting alcohol use. ? Practicing safe sex. ? Taking low-dose aspirin daily starting at age 41. ? Taking vitamin and mineral supplements as recommended by your health care provider. What happens during an annual well check? The services and screenings done by your health care provider during your annual well check will depend on your age, overall health, lifestyle risk factors, and family history of disease. Counseling Your health care provider may ask you questions about your:  Alcohol use.  Tobacco use.  Drug use.  Emotional well-being.  Home and relationship well-being.  Sexual activity.  Eating habits.  Work and work Statistician.  Method of birth control.  Menstrual cycle.  Pregnancy history.  Screening You may have the following tests or measurements:  Height, weight, and BMI.  Blood  pressure.  Lipid and cholesterol levels. These may be checked every 5 years, or more frequently if you are over 5 years old.  Skin check.  Lung cancer screening. You may have this screening every year starting at age 46 if you have a 30-pack-year history of smoking and currently smoke or have quit within the past 15 years.  Fecal occult blood test (FOBT) of the stool. You may have this test every year starting at age 60.  Flexible sigmoidoscopy or colonoscopy. You may have a sigmoidoscopy every 5 years or a colonoscopy every 10 years starting at age 62.  Hepatitis C blood test.  Hepatitis B blood test.  Sexually transmitted disease (STD) testing.  Diabetes screening. This is done by checking your blood sugar (glucose) after you have not eaten for a while (fasting). You may have this done every 1-3 years.  Mammogram. This may be done every 1-2 years. Talk to your health care provider about when you should start having regular mammograms. This may depend on whether you have a family history of breast cancer.  BRCA-related cancer screening. This may be done if you have a family history of breast, ovarian, tubal, or peritoneal cancers.  Pelvic exam and Pap test. This may be done every 3 years starting at age 38. Starting at age 5, this may be done every 5 years if you have a Pap test in combination with an HPV test.  Bone density scan. This is done to screen for osteoporosis. You may have this scan if you are at high risk for osteoporosis.  Discuss your test results, treatment options, and if  necessary, the need for more tests with your health care provider. Vaccines Your health care provider may recommend certain vaccines, such as:  Influenza vaccine. This is recommended every year.  Tetanus, diphtheria, and acellular pertussis (Tdap, Td) vaccine. You may need a Td booster every 10 years.  Varicella vaccine. You may need this if you have not been vaccinated.  Zoster vaccine. You  may need this after age 110.  Measles, mumps, and rubella (MMR) vaccine. You may need at least one dose of MMR if you were born in 1957 or later. You may also need a second dose.  Pneumococcal 13-valent conjugate (PCV13) vaccine. You may need this if you have certain conditions and were not previously vaccinated.  Pneumococcal polysaccharide (PPSV23) vaccine. You may need one or two doses if you smoke cigarettes or if you have certain conditions.  Meningococcal vaccine. You may need this if you have certain conditions.  Hepatitis A vaccine. You may need this if you have certain conditions or if you travel or work in places where you may be exposed to hepatitis A.  Hepatitis B vaccine. You may need this if you have certain conditions or if you travel or work in places where you may be exposed to hepatitis B.  Haemophilus influenzae type b (Hib) vaccine. You may need this if you have certain conditions.  Talk to your health care provider about which screenings and vaccines you need and how often you need them. This information is not intended to replace advice given to you by your health care provider. Make sure you discuss any questions you have with your health care provider. Document Released: 09/25/2015 Document Revised: 05/18/2016 Document Reviewed: 06/30/2015 Elsevier Interactive Patient Education  [employer].

## 2018-07-20 NOTE — Progress Notes (Signed)
Subjective:    Patient ID: Angela Snow, female    DOB: Oct 23, 1970, 47 y.o.   MRN: [id number]  HPI  Angela Snow is a 47 year old female who presents today for complete physical.  Immunizations: -Tetanus: Completed in 2017 -Influenza: Completed this season  Diet: She endorses a poor diet Breakfast: Eggs, bacon, fruit, peanut butter, yogurt Lunch: Salad, fast food (burgers, fries) Dinner: Take out food, occasionally cooks Snacks: Peanut butter, almonds, pudding  Desserts: Daily  Beverages: Water, coffee, occasional soda  Exercise: She working out twice weekly, recently started. Eye exam: No recent exam Dental exam: Completes annually  Pap Smear: Completed in 2018 Mammogram: Completed in 2019   Review of Systems  Constitutional: Negative for unexpected weight change.  HENT: Negative for rhinorrhea.   Respiratory: Negative for cough and shortness of breath.   Cardiovascular: Negative for chest pain.  Gastrointestinal: Negative for constipation and diarrhea.  Genitourinary: Negative for difficulty urinating and menstrual problem.  Musculoskeletal: Negative for arthralgias and myalgias.  Skin: Negative for rash.  Allergic/Immunologic: Negative for environmental allergies.  Neurological: Negative for dizziness, numbness and headaches.  Psychiatric/Behavioral: The patient is not nervous/anxious.        Past Medical History:  Diagnosis Date  . History of UTI   . TIA (transient ischemic attack)    atrial septal defect     Social History   Socioeconomic History  . Marital status: Married    Spouse name: Not on file  . Number of children: Not on file  . Years of education: Not on file  . Highest education level: Not on file  Occupational History  . Not on file  Social Needs  . Financial resource strain: Not on file  . Food insecurity:    Worry: Not on file    Inability: Not on file  . Transportation needs:    Medical: Not on file    Non-medical: Not on file    Tobacco Use  . Smoking status: Never Smoker  . Smokeless tobacco: Never Used  Substance and Sexual Activity  . Alcohol use: No    Alcohol/week: 0.0 standard drinks  . Drug use: Not on file  . Sexual activity: Not on file  Lifestyle  . Physical activity:    Days per week: Not on file    Minutes per session: Not on file  . Stress: Not on file  Relationships  . Social connections:    Talks on phone: Not on file    Gets together: Not on file    Attends religious service: Not on file    Active member of club or organization: Not on file    Attends meetings of clubs or organizations: Not on file    Relationship status: Not on file  . Intimate partner violence:    Fear of current or ex partner: Not on file    Emotionally abused: Not on file    Physically abused: Not on file    Forced sexual activity: Not on file  Other Topics Concern  . Not on file  Social History Narrative   Married.   2 children.   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   Enjoys spending time with family.    Past Surgical History:  Procedure Laterality Date  . ASD REPAIR    . BLADDER SURGERY  1976  . KIDNEY SURGERY  1976  . PATENT FORAMEN OVALE CLOSURE    . POLYPECTOMY  2016   uterine  Family History  Problem Relation Age of Onset  . Colon cancer Maternal Grandfather   . Hypertension Father   . Hyperlipidemia Father   . Hyperlipidemia Mother   . Breast cancer Neg Hx     Allergies  Allergen Reactions  . Penicillins Rash    No current outpatient medications on file prior to visit.   No current facility-administered medications on file prior to visit.     BP 122/72   Pulse 78   Temp 98.2 F (36.8 C) (Oral)   Ht 5' 4.5" (1.638 m)   Wt 184 lb (83.5 kg)   LMP 06/29/2017   SpO2 98%   BMI 31.10 kg/m    Objective:   Physical Exam  Constitutional: She is oriented to person, place, and time. She appears well-nourished.  HENT:  Mouth/Throat: No oropharyngeal  exudate.  Eyes: Pupils are equal, round, and reactive to light. EOM are normal.  Neck: Neck supple. No thyromegaly present.  Cardiovascular: Normal rate and regular rhythm.  Respiratory: Effort normal and breath sounds normal.  GI: Soft. Bowel sounds are normal. There is no tenderness.  Musculoskeletal: Normal range of motion.  Neurological: She is alert and oriented to person, place, and time.  Skin: Skin is warm and dry.  Psychiatric: She has a normal mood and affect.           Assessment & Plan:

## 2018-07-20 NOTE — Assessment & Plan Note (Signed)
Immunizations UTD. Pap smear and mammogram UTD. Recommended to work on diet, encouraged her to continue with regular exercise. Exam unremarkable. Labs reviewed. Follow up in 1 year for CPE.

## 2018-09-17 ENCOUNTER — Telehealth: Payer: 59 | Admitting: Family Medicine

## 2018-09-17 DIAGNOSIS — N390 Urinary tract infection, site not specified: Secondary | ICD-10-CM

## 2018-09-17 MED ORDER — NITROFURANTOIN MONOHYD MACRO 100 MG PO CAPS: 100.0000 mg | ORAL_CAPSULE | Freq: Two times a day (BID) | ORAL | 0 refills | Status: AC

## 2018-09-17 NOTE — Progress Notes (Signed)

## 2019-06-13 ENCOUNTER — Other Ambulatory Visit: Payer: Self-pay | Admitting: Obstetrics and Gynecology

## 2019-06-13 DIAGNOSIS — Z1231 Encounter for screening mammogram for malignant neoplasm of breast: Secondary | ICD-10-CM

## 2019-06-19 ENCOUNTER — Other Ambulatory Visit: Payer: Self-pay | Admitting: *Deleted

## 2019-06-19 DIAGNOSIS — Z20822 Contact with and (suspected) exposure to covid-19: Secondary | ICD-10-CM

## 2019-07-22 ENCOUNTER — Other Ambulatory Visit (INDEPENDENT_AMBULATORY_CARE_PROVIDER_SITE_OTHER): Payer: 59

## 2019-07-22 ENCOUNTER — Encounter: Payer: Self-pay | Admitting: Primary Care

## 2019-07-22 ENCOUNTER — Other Ambulatory Visit: Payer: Self-pay

## 2019-07-22 ENCOUNTER — Ambulatory Visit (INDEPENDENT_AMBULATORY_CARE_PROVIDER_SITE_OTHER): Payer: 59 | Admitting: Primary Care

## 2019-07-22 VITALS — BP 108/74 | HR 79 | Temp 97.2°F | Ht 64.0 in | Wt 181.0 lb

## 2019-07-22 DIAGNOSIS — R7309 Other abnormal glucose: Secondary | ICD-10-CM | POA: Diagnosis not present

## 2019-07-22 DIAGNOSIS — R739 Hyperglycemia, unspecified: Secondary | ICD-10-CM

## 2019-07-22 DIAGNOSIS — E559 Vitamin D deficiency, unspecified: Secondary | ICD-10-CM | POA: Diagnosis not present

## 2019-07-22 DIAGNOSIS — Z0000 Encounter for general adult medical examination without abnormal findings: Secondary | ICD-10-CM | POA: Diagnosis not present

## 2019-07-22 DIAGNOSIS — Z1211 Encounter for screening for malignant neoplasm of colon: Secondary | ICD-10-CM | POA: Diagnosis not present

## 2019-07-22 DIAGNOSIS — N393 Stress incontinence (female) (male): Secondary | ICD-10-CM | POA: Diagnosis not present

## 2019-07-22 LAB — CBC
HCT: 40.6 % (ref 36.0–46.0)
Hemoglobin: 13.8 g/dL (ref 12.0–15.0)
MCHC: 33.9 g/dL (ref 30.0–36.0)
MCV: 83.3 fl (ref 78.0–100.0)
Platelets: 222 10*3/uL (ref 150.0–400.0)
RBC: 4.87 Mil/uL (ref 3.87–5.11)
RDW: 13.1 % (ref 11.5–15.5)
WBC: 4.2 10*3/uL (ref 4.0–10.5)

## 2019-07-22 LAB — LIPID PANEL
Cholesterol: 242 mg/dL — ABNORMAL HIGH (ref 0–200)
HDL: 74.6 mg/dL (ref >39.00)
LDL Cholesterol: 156 mg/dL — ABNORMAL HIGH (ref 0–99)
NonHDL: 167.83
Total CHOL/HDL Ratio: 3
Triglycerides: 60 mg/dL (ref 0.0–149.0)
VLDL: 12 mg/dL (ref 0.0–40.0)

## 2019-07-22 LAB — COMPREHENSIVE METABOLIC PANEL
ALT: 13 U/L (ref 0–35)
AST: 14 U/L (ref 0–37)
Albumin: 4.2 g/dL (ref 3.5–5.2)
Alkaline Phosphatase: 84 U/L (ref 39–117)
BUN: 15 mg/dL (ref 6–23)
CO2: 30 mEq/L (ref 19–32)
Calcium: 9.2 mg/dL (ref 8.4–10.5)
Chloride: 103 mEq/L (ref 96–112)
Creatinine, Ser: 0.83 mg/dL (ref 0.40–1.20)
GFR: 73.42 mL/min (ref >60.00)
Glucose, Bld: 106 mg/dL — ABNORMAL HIGH (ref 70–99)
Potassium: 4.1 mEq/L (ref 3.5–5.1)
Sodium: 140 mEq/L (ref 135–145)
Total Bilirubin: 0.5 mg/dL (ref 0.2–1.2)
Total Protein: 6.6 g/dL (ref 6.0–8.3)

## 2019-07-22 LAB — HEMOGLOBIN A1C: Hgb A1c MFr Bld: 5.6 % (ref 4.6–6.5)

## 2019-07-22 LAB — VITAMIN D 25 HYDROXY (VIT D DEFICIENCY, FRACTURES): VITD: 26.78 ng/mL — ABNORMAL LOW (ref 30.00–100.00)

## 2019-07-22 NOTE — Assessment & Plan Note (Signed)
Not taking vitamin D, repeat level pending.

## 2019-07-22 NOTE — Assessment & Plan Note (Signed)
Immunizations UTD. Pap smear UTD, follows with GYN. Mammogram due, scheduled. Referral placed to GI for colon cancer screening. Discussed the importance of a healthy diet and regular exercise in order for weight loss, and to reduce the risk of any potential medical problems. Exam today unremarkable. Labs pending.

## 2019-07-22 NOTE — Patient Instructions (Signed)
Stop by the lab prior to leaving today. I will notify you of your results once received.   You will be contacted regarding your referral to GI for the colonoscopy.  Please let us know if you have not been contacted within two weeks.   Complete your mammogram as scheduled.  It's important to improve your diet by reducing consumption of fast food, fried food, processed snack foods, sugary drinks. Increase consumption of fresh vegetables and fruits, whole grains, water.  Ensure you are drinking 64 ounces of water daily.  Start exercising. You should be getting 150 minutes of moderate intensity exercise weekly.  It was a pleasure to see you today!   Preventive Care 42-43 Years Old, Female Preventive care refers to visits with your health care provider and lifestyle choices that can promote health and wellness. This includes:  A yearly physical exam. This may also be called an annual well check.  Regular dental visits and eye exams.  Immunizations.  Screening for certain conditions.  Healthy lifestyle choices, such as eating a healthy diet, getting regular exercise, not using drugs or products that contain nicotine and tobacco, and limiting alcohol use. What can I expect for my preventive care visit? Physical exam Your health care provider will check your:  Height and weight. This may be used to calculate body mass index (BMI), which tells if you are at a healthy weight.  Heart rate and blood pressure.  Skin for abnormal spots. Counseling Your health care provider may ask you questions about your:  Alcohol, tobacco, and drug use.  Emotional well-being.  Home and relationship well-being.  Sexual activity.  Eating habits.  Work and work Statistician.  Method of birth control.  Menstrual cycle.  Pregnancy history. What immunizations do I need?  Influenza (flu) vaccine  This is recommended every year. Tetanus, diphtheria, and pertussis (Tdap) vaccine  You may need  a Td booster every 10 years. Varicella (chickenpox) vaccine  You may need this if you have not been vaccinated. Zoster (shingles) vaccine  You may need this after age 35. Measles, mumps, and rubella (MMR) vaccine  You may need at least one dose of MMR if you were born in 1957 or later. You may also need a second dose. Pneumococcal conjugate (PCV13) vaccine  You may need this if you have certain conditions and were not previously vaccinated. Pneumococcal polysaccharide (PPSV23) vaccine  You may need one or two doses if you smoke cigarettes or if you have certain conditions. Meningococcal conjugate (MenACWY) vaccine  You may need this if you have certain conditions. Hepatitis A vaccine  You may need this if you have certain conditions or if you travel or work in places where you may be exposed to hepatitis A. Hepatitis B vaccine  You may need this if you have certain conditions or if you travel or work in places where you may be exposed to hepatitis B. Haemophilus influenzae type b (Hib) vaccine  You may need this if you have certain conditions. Human papillomavirus (HPV) vaccine  If recommended by your health care provider, you may need three doses over 6 months. You may receive vaccines as individual doses or as more than one vaccine together in one shot (combination vaccines). Talk with your health care provider about the risks and benefits of combination vaccines. What tests do I need? Blood tests  Lipid and cholesterol levels. These may be checked every 5 years, or more frequently if you are over 57 years old.  Hepatitis C  test.  Hepatitis B test. Screening  Lung cancer screening. You may have this screening every year starting at age 57 if you have a 30-pack-year history of smoking and currently smoke or have quit within the past 15 years.  Colorectal cancer screening. All adults should have this screening starting at age 95 and continuing until age 62. Your health  care provider may recommend screening at age 18 if you are at increased risk. You will have tests every 1-10 years, depending on your results and the type of screening test.  Diabetes screening. This is done by checking your blood sugar (glucose) after you have not eaten for a while (fasting). You may have this done every 1-3 years.  Mammogram. This may be done every 1-2 years. Talk with your health care provider about when you should start having regular mammograms. This may depend on whether you have a family history of breast cancer.  BRCA-related cancer screening. This may be done if you have a family history of breast, ovarian, tubal, or peritoneal cancers.  Pelvic exam and Pap test. This may be done every 3 years starting at age 90. Starting at age 22, this may be done every 5 years if you have a Pap test in combination with an HPV test. Other tests  Sexually transmitted disease (STD) testing.  Bone density scan. This is done to screen for osteoporosis. You may have this scan if you are at high risk for osteoporosis. Follow these instructions at home: Eating and drinking  Eat a diet that includes fresh fruits and vegetables, whole grains, lean protein, and low-fat dairy.  Take vitamin and mineral supplements as recommended by your health care provider.  Do not drink alcohol if: ? Your health care provider tells you not to drink. ? You are pregnant, may be pregnant, or are planning to become pregnant.  If you drink alcohol: ? Limit how much you have to 0-1 drink a day. ? Be aware of how much alcohol is in your drink. In the [place] one drink equals one 12 oz bottle of beer (355 mL), one 5 oz glass of wine (148 mL), or one 1 oz glass of hard liquor (44 mL). Lifestyle  Take daily care of your teeth and gums.  Stay active. Exercise for at least 30 minutes on 5 or more days each week.  Do not use any products that contain nicotine or tobacco, such as cigarettes, e-cigarettes, and  chewing tobacco. If you need help quitting, ask your health care provider.  If you are sexually active, practice safe sex. Use a condom or other form of birth control (contraception) in order to prevent pregnancy and STIs (sexually transmitted infections).  If told by your health care provider, take low-dose aspirin daily starting at age 62. What's next?  Visit your health care provider once a year for a well check visit.  Ask your health care provider how often you should have your eyes and teeth checked.  Stay up to date on all vaccines. This information is not intended to replace advice given to you by your health care provider. Make sure you discuss any questions you have with your health care provider. Document Released: 09/25/2015 Document Revised: 05/10/2018 Document Reviewed: 05/10/2018 Elsevier Patient Education  2020 [employer].

## 2019-07-22 NOTE — Progress Notes (Signed)
Subjective:    Patient ID: Angela Snow, female    DOB: [birth year], 48 y.o.   MRN: [id number]  HPI  Angela Snow is a 48 year old female who presents today for complete physical.  Immunizations: -Tetanus: Completed in 2017 -Influenza: Completed in September 2020  Diet: She endorses a poor diet. Mostly home cooked meals, take out food, fatty food. She is drinking soda, little water.  Exercise: She is not exercising   Eye exam: Completed over 1 year ago Dental exam: Completes semi-annually   Pap Smear: Completed in 2018, follows with GYN. Mammogram: Scheduled for November 2020 Colonoscopy: Never completed   BP Readings from Last 3 Encounters:  07/20/18 122/72  07/05/17 118/72  06/28/16 102/77     Review of Systems  Constitutional: Negative for unexpected weight change.  HENT: Negative for rhinorrhea.   Respiratory: Negative for cough and shortness of breath.   Cardiovascular: Negative for chest pain.  Gastrointestinal: Negative for constipation and diarrhea.  Genitourinary: Negative for difficulty urinating.  Musculoskeletal: Negative for arthralgias and myalgias.  Skin: Negative for rash.  Allergic/Immunologic: Negative for environmental allergies.  Neurological: Negative for dizziness and headaches.  Psychiatric/Behavioral: The patient is not nervous/anxious.        Past Medical History:  Diagnosis Date  . History of UTI   . TIA (transient ischemic attack)    atrial septal defect     Social History   Socioeconomic History  . Marital status: Married    Spouse name: Not on file  . Number of children: Not on file  . Years of education: Not on file  . Highest education level: Not on file  Occupational History  . Not on file  Social Needs  . Financial resource strain: Not on file  . Food insecurity    Worry: Not on file    Inability: Not on file  . Transportation needs    Medical: Not on file    Non-medical: Not on file  Tobacco Use  . Smoking  status: Never Smoker  . Smokeless tobacco: Never Used  Substance and Sexual Activity  . Alcohol use: No    Alcohol/week: 0.0 standard drinks  . Drug use: Not on file  . Sexual activity: Not on file  Lifestyle  . Physical activity    Days per week: Not on file    Minutes per session: Not on file  . Stress: Not on file  Relationships  . Social Herbalist on phone: Not on file    Gets together: Not on file    Attends religious service: Not on file    Active member of club or organization: Not on file    Attends meetings of clubs or organizations: Not on file    Relationship status: Not on file  . Intimate partner violence    Fear of current or ex partner: Not on file    Emotionally abused: Not on file    Physically abused: Not on file    Forced sexual activity: Not on file  Other Topics Concern  . Not on file  Social History Narrative   Married.   2 children.   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   Enjoys spending time with family.    Past Surgical History:  Procedure Laterality Date  . ASD REPAIR    . BLADDER SURGERY  1976  . KIDNEY SURGERY  1976  . PATENT FORAMEN OVALE CLOSURE    .  POLYPECTOMY  2016   uterine    Family History  Problem Relation Age of Onset  . Colon cancer Maternal Grandfather   . Hypertension Father   . Hyperlipidemia Father   . Hyperlipidemia Mother   . Breast cancer Neg Hx     Allergies  Allergen Reactions  . Penicillins Rash    No current outpatient medications on file prior to visit.   No current facility-administered medications on file prior to visit.     BP 108/74   Pulse 79   Temp (!) 97.2 F (36.2 C) (Temporal)   Ht 5\' 4"  (1.626 m)   Wt 181 lb (82.1 kg)   LMP 06/20/2017   SpO2 98%   BMI 31.07 kg/m    Objective:   Physical Exam  Constitutional: She is oriented to person, place, and time. She appears well-nourished.  HENT:  Right Ear: Tympanic membrane and ear canal normal.  Left  Ear: Tympanic membrane and ear canal normal.  Mouth/Throat: Oropharynx is clear and moist.  Eyes: Pupils are equal, round, and reactive to light. EOM are normal.  Neck: Neck supple.  Cardiovascular: Normal rate and regular rhythm.  Respiratory: Effort normal and breath sounds normal.  GI: Soft. Bowel sounds are normal. There is no abdominal tenderness.  Musculoskeletal: Normal range of motion.  Neurological: She is alert and oriented to person, place, and time.  Skin: Skin is warm and dry.  Psychiatric: She has a normal mood and affect.           Assessment & Plan:

## 2019-07-22 NOTE — Assessment & Plan Note (Signed)
Increased symptoms since weight gain. Encouraged a healthy diet and regular exercise. Overall symptoms are tolerable, she will update with any changes.

## 2019-07-24 ENCOUNTER — Encounter: Payer: Self-pay | Admitting: Gastroenterology

## 2019-07-30 ENCOUNTER — Other Ambulatory Visit: Payer: Self-pay

## 2019-07-30 ENCOUNTER — Ambulatory Visit
Admission: RE | Admit: 2019-07-30 | Discharge: 2019-07-30 | Disposition: A | Discharge status: Home / Self Care | Payer: 59 | Source: Ambulatory Visit | Attending: Obstetrics and Gynecology | Admitting: Obstetrics and Gynecology

## 2019-07-30 DIAGNOSIS — Z1231 Encounter for screening mammogram for malignant neoplasm of breast: Secondary | ICD-10-CM | POA: Diagnosis not present

## 2019-08-05 ENCOUNTER — Telehealth: Payer: Self-pay | Admitting: *Deleted

## 2019-08-05 NOTE — Telephone Encounter (Signed)
Yes, its fine to proceed with screening colon, average risk given its distant relative.  [place] task force is likely going to be changing guidelines soon to lower the age to 28 for screening.

## 2019-08-05 NOTE — Telephone Encounter (Signed)
Dr Silverio Decamp,  This pt is scheduled for a colon with you 08-30-2019 Friday. Her PCP sent a referral for a screening colon , [place] maternal grand father.    Is it appropriate for her to have a screen colon at age 48 with this history?  Please advise, Thanks, Lelan Pons

## 2019-08-05 NOTE — Telephone Encounter (Signed)
Thank you will proceed as scheduled  

## 2019-08-16 ENCOUNTER — Other Ambulatory Visit: Payer: Self-pay

## 2019-08-16 ENCOUNTER — Ambulatory Visit (AMBULATORY_SURGERY_CENTER): Payer: 59

## 2019-08-16 VITALS — Temp 97.7°F | Ht 64.0 in | Wt 189.4 lb

## 2019-08-16 DIAGNOSIS — Z1211 Encounter for screening for malignant neoplasm of colon: Secondary | ICD-10-CM

## 2019-08-16 DIAGNOSIS — Z1159 Encounter for screening for other viral diseases: Secondary | ICD-10-CM

## 2019-08-16 MED ORDER — NA SULFATE-K SULFATE-MG SULF 17.5-3.13-1.6 GM/177ML PO SOLN: 1.0000 | Freq: Once | ORAL | 0 refills | Status: AC

## 2019-08-16 NOTE — Progress Notes (Signed)

## 2019-08-19 ENCOUNTER — Encounter: Payer: Self-pay | Admitting: Gastroenterology

## 2019-08-23 ENCOUNTER — Other Ambulatory Visit: Payer: Self-pay

## 2019-08-23 MED ORDER — NA SULFATE-K SULFATE-MG SULF 17.5-3.13-1.6 GM/177ML PO SOLN: 1.0000 | ORAL | 0 refills | Status: DC

## 2019-08-28 ENCOUNTER — Ambulatory Visit (INDEPENDENT_AMBULATORY_CARE_PROVIDER_SITE_OTHER): Payer: 59

## 2019-08-28 ENCOUNTER — Other Ambulatory Visit: Payer: Self-pay | Admitting: Gastroenterology

## 2019-08-28 DIAGNOSIS — Z1159 Encounter for screening for other viral diseases: Secondary | ICD-10-CM | POA: Diagnosis not present

## 2019-08-29 LAB — SARS CORONAVIRUS 2 (TAT 6-24 HRS): SARS Coronavirus 2: NEGATIVE

## 2019-08-30 ENCOUNTER — Encounter: Payer: Self-pay | Admitting: Gastroenterology

## 2019-08-30 ENCOUNTER — Other Ambulatory Visit: Payer: Self-pay

## 2019-08-30 ENCOUNTER — Ambulatory Visit (AMBULATORY_SURGERY_CENTER): Payer: 59 | Admitting: Gastroenterology

## 2019-08-30 VITALS — BP 130/76 | HR 64 | Temp 98.1°F | Resp 13 | Ht 64.0 in | Wt 189.4 lb

## 2019-08-30 DIAGNOSIS — K635 Polyp of colon: Secondary | ICD-10-CM | POA: Diagnosis not present

## 2019-08-30 DIAGNOSIS — D123 Benign neoplasm of transverse colon: Secondary | ICD-10-CM

## 2019-08-30 DIAGNOSIS — Z1211 Encounter for screening for malignant neoplasm of colon: Secondary | ICD-10-CM

## 2019-08-30 MED ORDER — SODIUM CHLORIDE 0.9 % IV SOLN: 500.0000 mL | INTRAVENOUS | Status: DC

## 2019-08-30 NOTE — Progress Notes (Signed)
PT taken to PACU. Monitors in place. VSS. Report given to RN. 

## 2019-08-30 NOTE — Progress Notes (Signed)
Pt's states no medical or surgical changes since previsit.  TempJB, Deva Ron Checked in, [place] IV, VS=Courtney  Called to room to assist during endoscopic procedure.  Patient ID and intended procedure confirmed with present staff. Received instructions for my participation in the procedure from the performing physician.

## 2019-08-30 NOTE — Patient Instructions (Signed)
YOU HAD AN ENDOSCOPIC PROCEDURE TODAY AT THE [facility]:   Refer to the procedure report that was given to you for any specific questions about what was found during the examination.  If the procedure report does not answer your questions, please call your gastroenterologist to clarify.  If you requested that your care partner not be given the details of your procedure findings, then the procedure report has been included in a sealed envelope for you to review at your convenience later.  YOU SHOULD EXPECT: Some feelings of bloating in the abdomen. Passage of more gas than usual.  Walking can help get rid of the air that was put into your GI tract during the procedure and reduce the bloating. If you had a lower endoscopy (such as a colonoscopy or flexible sigmoidoscopy) you may notice spotting of blood in your stool or on the toilet paper. If you underwent a bowel prep for your procedure, you may not have a normal bowel movement for a few days.  Please Note:  You might notice some irritation and congestion in your nose or some drainage.  This is from the oxygen used during your procedure.  There is no need for concern and it should clear up in a day or so.  SYMPTOMS TO REPORT IMMEDIATELY:   Following lower endoscopy (colonoscopy or flexible sigmoidoscopy):  Excessive amounts of blood in the stool  Significant tenderness or worsening of abdominal pains  Swelling of the abdomen that is new, acute  Fever of 100F or higher  For urgent or emergent issues, a gastroenterologist can be reached at any hour by calling [phone].   DIET:  We do recommend a small meal at first, but then you may proceed to your regular diet.  Drink plenty of fluids but you should avoid alcoholic beverages for 24 hours.  ACTIVITY:  You should plan to take it easy for the rest of today and you should NOT DRIVE or use heavy machinery until tomorrow (because of the sedation medicines used during the test).     FOLLOW UP: Our staff will call the number listed on your records 48-72 hours following your procedure to check on you and address any questions or concerns that you may have regarding the information given to you following your procedure. If we do not reach you, we will leave a message.  We will attempt to reach you two times.  During this call, we will ask if you have developed any symptoms of COVID 19. If you develop any symptoms (ie: fever, flu-like symptoms, shortness of breath, cough etc.) before then, please call [phone].  If you test positive for Covid 19 in the 2 weeks post procedure, please call and report this information to us.    If any biopsies were taken you will be contacted by phone or by letter within the next 1-3 weeks.  Please call us at [phone] if you have not heard about the biopsies in 3 weeks.    SIGNATURES/CONFIDENTIALITY: You and/or your care partner have signed paperwork which will be entered into your electronic medical record.  These signatures attest to the fact that that the information above on your After Visit Summary has been reviewed and is understood.  Full responsibility of the confidentiality of this discharge information lies with you and/or your care-partner. 

## 2019-08-30 NOTE — Op Note (Signed)
[name] Procedure Date: 08/30/2019 12:52 PM MRN: [id number] Endoscopist: Mauri Pole , MD Age: 48 Referring MD:  Date of Birth: [birth year] Gender: Female Account #: [id number] Procedure:                Colonoscopy Indications:              Screening for colorectal malignant neoplasm Medicines:                Monitored Anesthesia Care Procedure:                Pre-Anesthesia Assessment:                           - Prior to the procedure, a History and Physical                            was performed, and patient medications and                            allergies were reviewed. The patient's tolerance of                            previous anesthesia was also reviewed. The risks                            and benefits of the procedure and the sedation                            options and risks were discussed with the patient.                            All questions were answered, and informed consent                            was obtained. Prior Anticoagulants: The patient has                            taken no previous anticoagulant or antiplatelet                            agents. ASA Grade Assessment: II - A patient with                            mild systemic disease. After reviewing the risks                            and benefits, the patient was deemed in                            satisfactory condition to undergo the procedure.                           After obtaining informed consent, the colonoscope  was passed under direct vision. Throughout the                            procedure, the patient's blood pressure, pulse, and                            oxygen saturations were monitored continuously. The                            Colonoscope was introduced through the anus and                            advanced to the the cecum, identified by                            appendiceal orifice  and ileocecal valve. The                            colonoscopy was performed without difficulty. The                            patient tolerated the procedure well. The quality                            of the bowel preparation was excellent. The                            ileocecal valve, appendiceal orifice, and rectum                            were photographed. Scope In: 1:28:34 PM Scope Out: 1:50:18 PM Scope Withdrawal Time: 0 hours 16 minutes 37 seconds  Total Procedure Duration: 0 hours 21 minutes 44 seconds  Findings:                 The perianal and digital rectal examinations were                            normal.                           A 1 mm polyp was found in the transverse colon. The                            polyp was sessile. The polyp was removed with a                            cold biopsy forceps. Resection and retrieval were                            complete.                           Non-bleeding internal hemorrhoids were found during  retroflexion. The hemorrhoids were small.                           The exam was otherwise without abnormality. Complications:            No immediate complications. Estimated Blood Loss:     Estimated blood loss was minimal. Impression:               - One 1 mm polyp in the transverse colon, removed                            with a cold biopsy forceps. Resected and retrieved.                           - Non-bleeding internal hemorrhoids.                           - The examination was otherwise normal. Recommendation:           - Patient has a contact number available for                            emergencies. The signs and symptoms of potential                            delayed complications were discussed with the                            patient. Return to normal activities tomorrow.                            Written discharge instructions were provided to the                             patient.                           - Resume previous diet.                           - Continue present medications.                           - Await pathology results.                           - Repeat colonoscopy in 5-10 years for surveillance                            based on pathology results. Mauri Pole, MD 08/30/2019 1:54:45 PM This report has been signed electronically.

## 2019-09-03 ENCOUNTER — Telehealth: Payer: Self-pay

## 2019-09-03 NOTE — Telephone Encounter (Signed)
  Follow up Call-  Call back number 08/30/2019  Post procedure Call Back phone  # [phone]  Permission to leave phone message Yes  Some recent data might be hidden     Patient questions:  Do you have a fever, pain , or abdominal swelling? No. Pain Score  0 *  Have you tolerated food without any problems? Yes.    Have you been able to return to your normal activities? Yes.    Do you have any questions about your discharge instructions: Diet   No. Medications  No. Follow up visit  No.  Do you have questions or concerns about your Care? No.  Actions: * If pain score is 4 or above: No action needed, pain <4.

## 2019-09-03 NOTE — Telephone Encounter (Signed)
  Follow up Call-  Call back number 08/30/2019  Post procedure Call Back phone  # [phone]  Permission to leave phone message Yes  Some recent data might be hidden     Patient questions:  Do you have a fever, pain , or abdominal swelling? No. Pain Score  0 *  Have you tolerated food without any problems? Yes.    Have you been able to return to your normal activities? Yes.    Do you have any questions about your discharge instructions: Diet   No. Medications  No. Follow up visit  No.  Do you have questions or concerns about your Care? No.  Actions: * If pain score is 4 or above: No action needed, pain <4.  1. Have you developed a fever since your procedure? no  2.   Have you had an respiratory symptoms (SOB or cough) since your procedure? no  3.   Have you tested positive for COVID 19 since your procedure no  4.   Have you had any family members/close contacts diagnosed with the COVID 19 since your procedure?  no   If yes to any of these questions please route to Joylene John, RN and Alphonsa Gin, Therapist, sports.

## 2019-09-09 ENCOUNTER — Encounter: Payer: Self-pay | Admitting: Gastroenterology

## 2019-10-30 ENCOUNTER — Other Ambulatory Visit: Payer: Self-pay

## 2019-10-30 ENCOUNTER — Ambulatory Visit (INDEPENDENT_AMBULATORY_CARE_PROVIDER_SITE_OTHER): Payer: 59 | Admitting: Family Medicine

## 2019-10-30 ENCOUNTER — Encounter: Payer: Self-pay | Admitting: Family Medicine

## 2019-10-30 VITALS — BP 110/80 | HR 73 | Temp 98.3°F | Ht 64.0 in | Wt 185.8 lb

## 2019-10-30 DIAGNOSIS — M7051 Other bursitis of knee, right knee: Secondary | ICD-10-CM | POA: Diagnosis not present

## 2019-10-30 MED ORDER — DICLOFENAC SODIUM 1 % EX GEL: 4.0000 g | Freq: Four times a day (QID) | CUTANEOUS | 5 refills | Status: DC

## 2019-10-30 NOTE — Progress Notes (Signed)
[name], MD Primary Care and Sports Medicine [facility] at [facility] [place], [address] Phone: [phone]  FAX: [phone]  Angela Snow - 49 y.o. female  MRN [id number]  Date of Birth: 06/01/1971  Visit Date: 10/30/2019  PCP: Pleas Koch, NP  Referred by: Pleas Koch, NP  Chief Complaint  Patient presents with  . Knee Pain    Right-Hurt while exercising    This visit occurred during the SARS-CoV-2 public health emergency.  Safety protocols were in place, including screening questions prior to the visit, additional usage of staff PPE, and extensive cleaning of exam room while observing appropriate contact time as indicated for disinfecting solutions.   Subjective:   Angela Snow is a 49 y.o. very pleasant female patient with Body mass index is 31.88 kg/m. who presents with the following:  A couple of weeks started to do the couch to five - k.  Did some yoga. On stomach and significant flexion with yoga.  The next day did some running and got more pain.    She does have a modest limp.  This is pain on the medial aspect of the right knee.  She did not have any specific trauma.  She has been escalating the couch to 5K program and she previously has not been a runner and was not all that active prior to doing this.  She does not think it also began after some extreme flexion at a yoga class.  This has not improved with over-the-counter Motrin.  It has been progressively worse.  Motrin will hurt some.   No acute injury.  Medially will hurt. Jeans will hurt.   Review of Systems is noted in the HPI, as appropriate   Objective:   BP 110/80   Pulse 73   Temp 98.3 F (36.8 C) (Temporal)   Ht 5\' 4"  (1.626 m)   Wt 185 lb 12 oz (84.3 kg)   LMP 06/29/2017   SpO2 98%   BMI 31.88 kg/m   GEN: No acute distress; alert,appropriate. PULM: Breathing comfortably in no respiratory distress PSYCH: Normally  interactive.    Right knee: Hyperextensible about 2 degrees.  Flexion to 125 degrees without pain no pain at the medial lateral joint line.  ACL, PCL, MCL, and LCL are stable.  She does have tenderness at the [facility] anserine bursa on the right.  Radiology: No results found.  Assessment and Plan:     ICD-10-CM   1. Pes anserinus bursitis of right knee  M70.51    Level of Medical Decision-Making in this case is MODERATE.   Reviewed the relevant anatomy with the patient about the knee and pes anserinus.  Given a handout on Pes bursitis that outlines conservative treatment and basic stretching and strengthening.  Ice massage 3-4 times a day for a week NSAIDS routinely for 2-3 weeks Pes anserine injection is reasonable and usually provides rapid relief. For now, I am good to have her do oral NSAIDs as well as some topical Voltaren  Follow-up: No follow-ups on file.  Meds ordered this encounter  Medications  . diclofenac Sodium (VOLTAREN) 1 % GEL    Sig: Apply 4 g topically 4 (four) times daily.    Dispense:  300 g    Refill:  5   There are no discontinued medications. No orders of the defined types were placed in this encounter.   Signed,  Maud Deed. Jhonathan Desroches, MD  Outpatient Encounter Medications as of 10/30/2019  Medication Sig  . diclofenac Sodium (VOLTAREN) 1 % GEL Apply 4 g topically 4 (four) times daily.   No facility-administered encounter medications on file as of 10/30/2019.

## 2020-06-08 ENCOUNTER — Telehealth: Payer: 59 | Admitting: Physician Assistant

## 2020-06-08 DIAGNOSIS — R300 Dysuria: Secondary | ICD-10-CM

## 2020-06-08 MED ORDER — NITROFURANTOIN MONOHYD MACRO 100 MG PO CAPS: 100.0000 mg | ORAL_CAPSULE | Freq: Two times a day (BID) | ORAL | 0 refills | Status: AC

## 2020-06-08 NOTE — Progress Notes (Signed)
We are sorry that you are not feeling well.  Here is how we plan to help! ° °Based on what you shared with me it looks like you most likely have a simple urinary tract infection. ° °A UTI (Urinary Tract Infection) is a bacterial infection of the bladder. ° °Most cases of urinary tract infections are simple to treat but a key part of your care is to encourage you to drink plenty of fluids and watch your symptoms carefully. ° °I have prescribed MacroBid 100 mg twice a day for 5 days.  Your symptoms should gradually improve. Call us if the burning in your urine worsens, you develop worsening fever, back pain or pelvic pain or if your symptoms do not resolve after completing the antibiotic. ° °Urinary tract infections can be prevented by drinking plenty of water to keep your body hydrated.  Also be sure when you wipe, wipe from front to back and don't hold it in!  If possible, empty your bladder every 4 hours. ° °Your e-visit answers were reviewed by a board certified advanced clinical practitioner to complete your personal care plan.  Depending on the condition, your plan could have included both over the counter or prescription medications. ° °If there is a problem please reply  once you have received a response from your provider. ° °Your safety is important to us.  If you have drug allergies check your prescription carefully.   ° °You can use MyChart to ask questions about today’s visit, request a non-urgent call back, or ask for a work or school excuse for 24 hours related to this e-Visit. If it has been greater than 24 hours you will need to follow up with your provider, or enter a new e-Visit to address those concerns. ° ° °You will get an e-mail in the next two days asking about your experience.  I hope that your e-visit has been valuable and will speed your recovery. Thank you for using e-visits. ° °Approximately 5 minutes was spent documenting and reviewing patient's chart.  ° ° ° °

## 2020-07-24 ENCOUNTER — Other Ambulatory Visit: Payer: Self-pay | Admitting: Obstetrics and Gynecology

## 2020-07-24 DIAGNOSIS — Z1231 Encounter for screening mammogram for malignant neoplasm of breast: Secondary | ICD-10-CM

## 2020-07-31 ENCOUNTER — Encounter: Payer: Self-pay | Admitting: Primary Care

## 2020-07-31 ENCOUNTER — Ambulatory Visit (INDEPENDENT_AMBULATORY_CARE_PROVIDER_SITE_OTHER): Payer: 59 | Admitting: Primary Care

## 2020-07-31 ENCOUNTER — Other Ambulatory Visit: Payer: Self-pay

## 2020-07-31 VITALS — BP 110/74 | HR 85 | Temp 97.6°F | Ht 64.0 in | Wt 185.0 lb

## 2020-07-31 DIAGNOSIS — Z114 Encounter for screening for human immunodeficiency virus [HIV]: Secondary | ICD-10-CM | POA: Diagnosis not present

## 2020-07-31 DIAGNOSIS — Z1159 Encounter for screening for other viral diseases: Secondary | ICD-10-CM

## 2020-07-31 DIAGNOSIS — N393 Stress incontinence (female) (male): Secondary | ICD-10-CM

## 2020-07-31 DIAGNOSIS — E785 Hyperlipidemia, unspecified: Secondary | ICD-10-CM | POA: Diagnosis not present

## 2020-07-31 DIAGNOSIS — E559 Vitamin D deficiency, unspecified: Secondary | ICD-10-CM

## 2020-07-31 DIAGNOSIS — Z0000 Encounter for general adult medical examination without abnormal findings: Secondary | ICD-10-CM

## 2020-07-31 LAB — COMPREHENSIVE METABOLIC PANEL
ALT: 12 U/L (ref 0–35)
AST: 15 U/L (ref 0–37)
Albumin: 4.4 g/dL (ref 3.5–5.2)
Alkaline Phosphatase: 84 U/L (ref 39–117)
BUN: 14 mg/dL (ref 6–23)
CO2: 30 mEq/L (ref 19–32)
Calcium: 9.5 mg/dL (ref 8.4–10.5)
Chloride: 103 mEq/L (ref 96–112)
Creatinine, Ser: 0.88 mg/dL (ref 0.40–1.20)
GFR: 77.47 mL/min (ref >60.00)
Glucose, Bld: 99 mg/dL (ref 70–99)
Potassium: 4.1 mEq/L (ref 3.5–5.1)
Sodium: 140 mEq/L (ref 135–145)
Total Bilirubin: 0.5 mg/dL (ref 0.2–1.2)
Total Protein: 7.4 g/dL (ref 6.0–8.3)

## 2020-07-31 LAB — LIPID PANEL
Cholesterol: 226 mg/dL — ABNORMAL HIGH (ref 0–200)
HDL: 80.9 mg/dL (ref >39.00)
LDL Cholesterol: 136 mg/dL — ABNORMAL HIGH (ref 0–99)
NonHDL: 144.85
Total CHOL/HDL Ratio: 3
Triglycerides: 45 mg/dL (ref 0.0–149.0)
VLDL: 9 mg/dL (ref 0.0–40.0)

## 2020-07-31 LAB — HEMOGLOBIN A1C: Hgb A1c MFr Bld: 5.6 % (ref 4.6–6.5)

## 2020-07-31 LAB — VITAMIN D 25 HYDROXY (VIT D DEFICIENCY, FRACTURES): VITD: 22.27 ng/mL — ABNORMAL LOW (ref 30.00–100.00)

## 2020-07-31 NOTE — Assessment & Plan Note (Signed)
Chronic but stable. Continue to monitor.

## 2020-07-31 NOTE — Patient Instructions (Addendum)
Stop by the lab prior to leaving today. I will notify you of your results once received.   Continue exercising. You should be getting 150 minutes of moderate intensity exercise weekly.  It's important to improve your diet by reducing consumption of fast food, fried food, processed snack foods, sugary drinks. Increase consumption of fresh vegetables and fruits, whole grains, water.  Ensure you are drinking 64 ounces of water daily.  It was a pleasure to see you today!   Preventive Care 10-80 Years Old, Female Preventive care refers to visits with your health care provider and lifestyle choices that can promote health and wellness. This includes:  A yearly physical exam. This may also be called an annual well check.  Regular dental visits and eye exams.  Immunizations.  Screening for certain conditions.  Healthy lifestyle choices, such as eating a healthy diet, getting regular exercise, not using drugs or products that contain nicotine and tobacco, and limiting alcohol use. What can I expect for my preventive care visit? Physical exam Your health care provider will check your:  Height and weight. This may be used to calculate body mass index (BMI), which tells if you are at a healthy weight.  Heart rate and blood pressure.  Skin for abnormal spots. Counseling Your health care provider may ask you questions about your:  Alcohol, tobacco, and drug use.  Emotional well-being.  Home and relationship well-being.  Sexual activity.  Eating habits.  Work and work Statistician.  Method of birth control.  Menstrual cycle.  Pregnancy history. What immunizations do I need?  Influenza (flu) vaccine  This is recommended every year. Tetanus, diphtheria, and pertussis (Tdap) vaccine  You may need a Td booster every 10 years. Varicella (chickenpox) vaccine  You may need this if you have not been vaccinated. Zoster (shingles) vaccine  You may need this after age  6. Measles, mumps, and rubella (MMR) vaccine  You may need at least one dose of MMR if you were born in 1957 or later. You may also need a second dose. Pneumococcal conjugate (PCV13) vaccine  You may need this if you have certain conditions and were not previously vaccinated. Pneumococcal polysaccharide (PPSV23) vaccine  You may need one or two doses if you smoke cigarettes or if you have certain conditions. Meningococcal conjugate (MenACWY) vaccine  You may need this if you have certain conditions. Hepatitis A vaccine  You may need this if you have certain conditions or if you travel or work in places where you may be exposed to hepatitis A. Hepatitis B vaccine  You may need this if you have certain conditions or if you travel or work in places where you may be exposed to hepatitis B. Haemophilus influenzae type b (Hib) vaccine  You may need this if you have certain conditions. Human papillomavirus (HPV) vaccine  If recommended by your health care provider, you may need three doses over 6 months. You may receive vaccines as individual doses or as more than one vaccine together in one shot (combination vaccines). Talk with your health care provider about the risks and benefits of combination vaccines. What tests do I need? Blood tests  Lipid and cholesterol levels. These may be checked every 5 years, or more frequently if you are over 5 years old.  Hepatitis C test.  Hepatitis B test. Screening  Lung cancer screening. You may have this screening every year starting at age 19 if you have a 30-pack-year history of smoking and currently smoke or have  quit within the past 15 years.  Colorectal cancer screening. All adults should have this screening starting at age 94 and continuing until age 41. Your health care provider may recommend screening at age 8 if you are at increased risk. You will have tests every 1-10 years, depending on your results and the type of screening  test.  Diabetes screening. This is done by checking your blood sugar (glucose) after you have not eaten for a while (fasting). You may have this done every 1-3 years.  Mammogram. This may be done every 1-2 years. Talk with your health care provider about when you should start having regular mammograms. This may depend on whether you have a family history of breast cancer.  BRCA-related cancer screening. This may be done if you have a family history of breast, ovarian, tubal, or peritoneal cancers.  Pelvic exam and Pap test. This may be done every 3 years starting at age 24. Starting at age 71, this may be done every 5 years if you have a Pap test in combination with an HPV test. Other tests  Sexually transmitted disease (STD) testing.  Bone density scan. This is done to screen for osteoporosis. You may have this scan if you are at high risk for osteoporosis. Follow these instructions at home: Eating and drinking  Eat a diet that includes fresh fruits and vegetables, whole grains, lean protein, and low-fat dairy.  Take vitamin and mineral supplements as recommended by your health care provider.  Do not drink alcohol if: ? Your health care provider tells you not to drink. ? You are pregnant, may be pregnant, or are planning to become pregnant.  If you drink alcohol: ? Limit how much you have to 0-1 drink a day. ? Be aware of how much alcohol is in your drink. In the [place] one drink equals one 12 oz bottle of beer (355 mL), one 5 oz glass of wine (148 mL), or one 1 oz glass of hard liquor (44 mL). Lifestyle  Take daily care of your teeth and gums.  Stay active. Exercise for at least 30 minutes on 5 or more days each week.  Do not use any products that contain nicotine or tobacco, such as cigarettes, e-cigarettes, and chewing tobacco. If you need help quitting, ask your health care provider.  If you are sexually active, practice safe sex. Use a condom or other form of birth control  (contraception) in order to prevent pregnancy and STIs (sexually transmitted infections).  If told by your health care provider, take low-dose aspirin daily starting at age 15. What's next?  Visit your health care provider once a year for a well check visit.  Ask your health care provider how often you should have your eyes and teeth checked.  Stay up to date on all vaccines. This information is not intended to replace advice given to you by your health care provider. Make sure you discuss any questions you have with your health care provider. Document Revised: 05/10/2018 Document Reviewed: 05/10/2018 Elsevier Patient Education  [place] Maintenance Due  Topic Date Due  . Hepatitis C Screening  Tested today  Never done  . HIV Screening   Tested today  Never done  . PAP SMEAR-Modifier  Has appointment next month with GYN  07/24/2020    Depression screen PHQ 2/9 07/31/2020  Decreased Interest 0  Down, Depressed, Hopeless 0  PHQ - 2 Score 0  Altered sleeping 0  Tired,  decreased energy 0  Change in appetite 0  Feeling bad or failure about yourself  0  Trouble concentrating 0  Moving slowly or fidgety/restless 0  Suicidal thoughts 0  PHQ-9 Score 0  Difficult doing work/chores Not difficult at all    Recommended follow up: No follow-ups on file.

## 2020-07-31 NOTE — Assessment & Plan Note (Signed)
Immunizations UTD. Pap smear UTD, follows with GYN. Mammogram scheduled for next week.  Discussed the importance of a healthy diet and regular exercise in order for weight loss, and to reduce the risk of any potential medical problems.  Exam today benign. Labs pending.

## 2020-07-31 NOTE — Assessment & Plan Note (Signed)
Not currently taking vitamin D supplements.  Repeat vitamin D level pending.

## 2020-07-31 NOTE — Progress Notes (Signed)
Subjective:    Patient ID: Angela Snow, female    DOB: [birth year], 49 y.o.   MRN: [id number]  HPI  This visit occurred during the SARS-CoV-2 public health emergency.  Safety protocols were in place, including screening questions prior to the visit, additional usage of staff PPE, and extensive cleaning of exam room while observing appropriate contact time as indicated for disinfecting solutions.   Angela Snow is a 49 year old female who presents today for complete physical.  Immunizations: -Tetanus: Completed in 2017 -Influenza: Completed this season  -Covid-19: Completed series   Diet: She endorses a poor diet.  Exercise: She is walking some.  Eye exam: Due Dental exam: Completes semi-annually   Pap Smear: UTD, follows with GYN Mammogram: Scheduled for next week Colonoscopy: Completed in 2020, due in 2025  BP Readings from Last 3 Encounters:  07/31/20 110/74  10/30/19 110/80  08/30/19 130/76      Review of Systems  Constitutional: Negative for unexpected weight change.  HENT: Negative for rhinorrhea.   Eyes: Negative for visual disturbance.  Respiratory: Negative for cough and shortness of breath.   Cardiovascular: Negative for chest pain.  Gastrointestinal: Negative for constipation and diarrhea.  Genitourinary: Negative for difficulty urinating and menstrual problem.  Musculoskeletal: Negative for arthralgias and myalgias.  Skin: Negative for rash.  Allergic/Immunologic: Positive for environmental allergies.  Neurological: Negative for dizziness and headaches.  Psychiatric/Behavioral: The patient is not nervous/anxious.        Past Medical History:  Diagnosis Date  . History of UTI   . TIA (transient ischemic attack)    atrial septal defect     Social History   Socioeconomic History  . Marital status: Married    Spouse name: Not on file  . Number of children: Not on file  . Years of education: Not on file  . Highest education level: Not on file    Occupational History  . Not on file  Tobacco Use  . Smoking status: Former Smoker    Years: 10.00    Types: Cigarettes    Quit date: 2010    Years since quitting: 11.8  . Smokeless tobacco: Never Used  Vaping Use  . Vaping Use: Never used  Substance and Sexual Activity  . Alcohol use: No    Alcohol/week: 0.0 standard drinks  . Drug use: Never  . Sexual activity: Not on file  Other Topics Concern  . Not on file  Social History Narrative   Married.   2 children.   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   Enjoys spending time with family.   Social Determinants of Health   Financial Resource Strain:   . Difficulty of Paying Living Expenses: Not on file  Food Insecurity:   . Worried About Charity fundraiser in the Last Year: Not on file  . Ran Out of Food in the Last Year: Not on file  Transportation Needs:   . Lack of Transportation (Medical): Not on file  . Lack of Transportation (Non-Medical): Not on file  Physical Activity:   . Days of Exercise per Week: Not on file  . Minutes of Exercise per Session: Not on file  Stress:   . Feeling of Stress : Not on file  Social Connections:   . Frequency of Communication with Friends and Family: Not on file  . Frequency of Social Gatherings with Friends and Family: Not on file  . Attends Religious Services: Not on file  .  Active Member of Clubs or Organizations: Not on file  . Attends Archivist Meetings: Not on file  . Marital Status: Not on file  Intimate Partner Violence:   . Fear of Current or Ex-Partner: Not on file  . Emotionally Abused: Not on file  . Physically Abused: Not on file  . Sexually Abused: Not on file    Past Surgical History:  Procedure Laterality Date  . ASD REPAIR    . BLADDER SURGERY  1976  . KIDNEY SURGERY  1976  . PATENT FORAMEN OVALE CLOSURE    . POLYPECTOMY  2016   uterine    Family History  Problem Relation Age of Onset  . Colon cancer Maternal  Grandfather   . Hypertension Father   . Hyperlipidemia Father   . Hyperlipidemia Mother   . Breast cancer Neg Hx   . Colon polyps Neg Hx   . Esophageal cancer Neg Hx   . Rectal cancer Neg Hx   . Stomach cancer Neg Hx     Allergies  Allergen Reactions  . Penicillins Rash    No current outpatient medications on file prior to visit.   No current facility-administered medications on file prior to visit.    BP 110/74   Pulse 85   Temp 97.6 F (36.4 C) (Temporal)   Ht 5\' 4"  (1.626 m)   Wt 185 lb (83.9 kg)   LMP 06/29/2017   SpO2 96%   BMI 31.76 kg/m    Objective:   Physical Exam HENT:     Right Ear: Tympanic membrane and ear canal normal.     Left Ear: Tympanic membrane and ear canal normal.  Eyes:     Pupils: Pupils are equal, round, and reactive to light.  Cardiovascular:     Rate and Rhythm: Normal rate and regular rhythm.  Pulmonary:     Effort: Pulmonary effort is normal.     Breath sounds: Normal breath sounds.  Abdominal:     General: Bowel sounds are normal.     Palpations: Abdomen is soft.     Tenderness: There is no abdominal tenderness.  Musculoskeletal:        General: Normal range of motion.     Cervical back: Neck supple.  Skin:    General: Skin is warm and dry.  Neurological:     Mental Status: She is alert and oriented to person, place, and time.     Cranial Nerves: No cranial nerve deficit.     Deep Tendon Reflexes:     Reflex Scores:      Patellar reflexes are 2+ on the right side and 2+ on the left side. Psychiatric:        Mood and Affect: Mood normal.            Assessment & Plan:

## 2020-08-03 LAB — HEPATITIS C ANTIBODY
Hepatitis C Ab: NONREACTIVE
SIGNAL TO CUT-OFF: 0.01 (ref <1.00)

## 2020-08-03 LAB — HIV ANTIBODY (ROUTINE TESTING W REFLEX): HIV 1&2 Ab, 4th Generation: NONREACTIVE

## 2020-08-05 ENCOUNTER — Other Ambulatory Visit: Payer: Self-pay

## 2020-08-05 ENCOUNTER — Ambulatory Visit
Admission: RE | Admit: 2020-08-05 | Discharge: 2020-08-05 | Disposition: A | Discharge status: Home / Self Care | Payer: 59 | Source: Ambulatory Visit

## 2020-08-05 DIAGNOSIS — Z1231 Encounter for screening mammogram for malignant neoplasm of breast: Secondary | ICD-10-CM | POA: Diagnosis not present

## 2020-08-24 DIAGNOSIS — Z6834 Body mass index (BMI) 34.0-34.9, adult: Secondary | ICD-10-CM | POA: Diagnosis not present

## 2020-08-24 DIAGNOSIS — Z130 Encounter for screening for diseases of the blood and blood-forming organs and certain disorders involving the immune mechanism: Secondary | ICD-10-CM | POA: Diagnosis not present

## 2020-08-24 DIAGNOSIS — N898 Other specified noninflammatory disorders of vagina: Secondary | ICD-10-CM | POA: Diagnosis not present

## 2020-08-24 DIAGNOSIS — Z1389 Encounter for screening for other disorder: Secondary | ICD-10-CM | POA: Diagnosis not present

## 2020-08-24 DIAGNOSIS — Z124 Encounter for screening for malignant neoplasm of cervix: Secondary | ICD-10-CM | POA: Diagnosis not present

## 2020-08-24 DIAGNOSIS — Z1151 Encounter for screening for human papillomavirus (HPV): Secondary | ICD-10-CM | POA: Diagnosis not present

## 2020-08-24 DIAGNOSIS — Z01419 Encounter for gynecological examination (general) (routine) without abnormal findings: Secondary | ICD-10-CM | POA: Diagnosis not present

## 2020-08-26 LAB — RESULTS CONSOLE HPV: CHL HPV: NEGATIVE

## 2020-08-26 LAB — HM PAP SMEAR

## 2020-08-31 DIAGNOSIS — N898 Other specified noninflammatory disorders of vagina: Secondary | ICD-10-CM | POA: Diagnosis not present

## 2020-11-11 DIAGNOSIS — N898 Other specified noninflammatory disorders of vagina: Secondary | ICD-10-CM | POA: Diagnosis not present

## 2020-11-17 ENCOUNTER — Other Ambulatory Visit (HOSPITAL_COMMUNITY): Payer: Self-pay | Admitting: Obstetrics and Gynecology

## 2020-11-17 ENCOUNTER — Other Ambulatory Visit: Payer: Self-pay | Admitting: Obstetrics and Gynecology

## 2020-11-18 ENCOUNTER — Other Ambulatory Visit (HOSPITAL_COMMUNITY): Payer: Self-pay | Admitting: Obstetrics and Gynecology

## 2020-11-18 ENCOUNTER — Other Ambulatory Visit: Payer: Self-pay | Admitting: Obstetrics and Gynecology

## 2020-11-18 ENCOUNTER — Encounter (HOSPITAL_COMMUNITY): Payer: Self-pay

## 2020-11-18 ENCOUNTER — Ambulatory Visit (HOSPITAL_COMMUNITY): Payer: 59

## 2020-11-18 DIAGNOSIS — N898 Other specified noninflammatory disorders of vagina: Secondary | ICD-10-CM

## 2020-11-25 ENCOUNTER — Encounter (HOSPITAL_COMMUNITY): Payer: Self-pay

## 2020-11-25 ENCOUNTER — Ambulatory Visit (HOSPITAL_COMMUNITY)
Admission: RE | Admit: 2020-11-25 | Discharge: 2020-11-25 | Disposition: A | Discharge status: Home / Self Care | Payer: 59 | Source: Ambulatory Visit | Attending: Obstetrics and Gynecology | Admitting: Obstetrics and Gynecology

## 2020-11-25 ENCOUNTER — Other Ambulatory Visit: Payer: Self-pay

## 2020-11-25 DIAGNOSIS — N854 Malposition of uterus: Secondary | ICD-10-CM | POA: Diagnosis not present

## 2020-11-25 DIAGNOSIS — D259 Leiomyoma of uterus, unspecified: Secondary | ICD-10-CM | POA: Diagnosis not present

## 2020-11-25 DIAGNOSIS — N898 Other specified noninflammatory disorders of vagina: Secondary | ICD-10-CM | POA: Diagnosis not present

## 2020-11-25 DIAGNOSIS — I878 Other specified disorders of veins: Secondary | ICD-10-CM | POA: Diagnosis not present

## 2020-11-25 MED ORDER — IOHEXOL 300 MG/ML  SOLN
100.0000 mL | Freq: Once | INTRAMUSCULAR | Status: AC | PRN
  Administered 2020-11-25: 100 mL via INTRAVENOUS

## 2021-06-03 ENCOUNTER — Other Ambulatory Visit: Payer: Self-pay | Admitting: Obstetrics and Gynecology

## 2021-06-03 DIAGNOSIS — Z1231 Encounter for screening mammogram for malignant neoplasm of breast: Secondary | ICD-10-CM

## 2021-07-24 ENCOUNTER — Telehealth: Payer: 59 | Admitting: Nurse Practitioner

## 2021-07-24 DIAGNOSIS — J208 Acute bronchitis due to other specified organisms: Secondary | ICD-10-CM | POA: Diagnosis not present

## 2021-07-24 MED ORDER — PREDNISONE 10 MG PO TABS: 10.0000 mg | ORAL_TABLET | Freq: Every day | ORAL | 0 refills | Status: AC

## 2021-07-24 NOTE — Progress Notes (Signed)
I have spent 5 minutes in review of e-visit questionnaire, review and updating patient chart, medical decision making and response to patient.  ° °Kingsly Kloepfer W Angel Weedon, NP ° °  °

## 2021-07-24 NOTE — Progress Notes (Signed)
We are sorry that you are not feeling well.  Here is how we plan to help!  Based on your presentation I believe you most likely have A cough due to a virus.  This is called viral bronchitis and is best treated by rest, plenty of fluids and control of the cough.  You may use Ibuprofen or Tylenol as directed to help your symptoms.     In addition I have prescribed.   Prednisone 10 mg daily for 6 days  At this time based on antibiotic usage protocols your symptoms do not warrant an antibiotic prescription.  However if your symptoms seem to worsen in 3 to 5 days we would be glad to re evaluate your symptoms.   From your responses in the eVisit questionnaire you describe inflammation in the upper respiratory tract which is causing a significant cough.  This is commonly called Bronchitis and has four common causes:   Allergies Viral Infections Acid Reflux Bacterial Infection Allergies, viruses and acid reflux are treated by controlling symptoms or eliminating the cause. An example might be a cough caused by taking certain blood pressure medications. You stop the cough by changing the medication. Another example might be a cough caused by acid reflux. Controlling the reflux helps control the cough.  USE OF BRONCHODILATOR ("RESCUE") INHALERS: There is a risk from using your bronchodilator too frequently.  The risk is that over-reliance on a medication which only relaxes the muscles surrounding the breathing tubes can reduce the effectiveness of medications prescribed to reduce swelling and congestion of the tubes themselves.  Although you feel brief relief from the bronchodilator inhaler, your asthma may actually be worsening with the tubes becoming more swollen and filled with mucus.  This can delay other crucial treatments, such as oral steroid medications. If you need to use a bronchodilator inhaler daily, several times per day, you should discuss this with your provider.  There are probably better  treatments that could be used to keep your asthma under control.     HOME CARE Only take medications as instructed by your medical team. Complete the entire course of an antibiotic. Drink plenty of fluids and get plenty of rest. Avoid close contacts especially the very young and the elderly Cover your mouth if you cough or cough into your sleeve. Always remember to wash your hands A steam or ultrasonic humidifier can help congestion.   GET HELP RIGHT AWAY IF: You develop worsening fever. You become short of breath You cough up blood. Your symptoms persist after you have completed your treatment plan MAKE SURE YOU  Understand these instructions. Will watch your condition. Will get help right away if you are not doing well or get worse.    Thank you for choosing an e-visit.  Your e-visit answers were reviewed by a board certified advanced clinical practitioner to complete your personal care plan. Depending upon the condition, your plan could have included both over the counter or prescription medications.  Please review your pharmacy choice. Make sure the pharmacy is open so you can pick up prescription now. If there is a problem, you may contact your provider through [employer] and have the prescription routed to another pharmacy.  Your safety is important to [place]. If you have drug allergies check your prescription carefully.   For the next 24 hours you can use MyChart to ask questions about today's visit, request a non-urgent call back, or ask for a work or school excuse. You will get an email  in the next two days asking about your experience. I hope that your e-visit has been valuable and will speed your recovery.

## 2021-07-27 ENCOUNTER — Other Ambulatory Visit: Payer: Self-pay

## 2021-07-27 MED ORDER — ESTRADIOL-NORETHINDRONE ACET 1-0.5 MG PO TABS
ORAL_TABLET | ORAL | 4 refills | Status: DC
  Filled 2021-07-27: qty 84, 84d supply, fill #0
  Filled 2021-11-05: qty 84, 84d supply, fill #1
  Filled 2022-04-05: qty 84, 84d supply, fill #2

## 2021-08-03 ENCOUNTER — Other Ambulatory Visit: Payer: Self-pay

## 2021-08-03 ENCOUNTER — Ambulatory Visit (INDEPENDENT_AMBULATORY_CARE_PROVIDER_SITE_OTHER): Payer: 59 | Admitting: Primary Care

## 2021-08-03 VITALS — BP 108/80 | HR 82 | Temp 97.0°F | Ht 64.75 in | Wt 195.4 lb

## 2021-08-03 DIAGNOSIS — E559 Vitamin D deficiency, unspecified: Secondary | ICD-10-CM | POA: Diagnosis not present

## 2021-08-03 DIAGNOSIS — Z0000 Encounter for general adult medical examination without abnormal findings: Secondary | ICD-10-CM

## 2021-08-03 DIAGNOSIS — N393 Stress incontinence (female) (male): Secondary | ICD-10-CM | POA: Diagnosis not present

## 2021-08-03 LAB — CBC
HCT: 42.3 % (ref 36.0–46.0)
Hemoglobin: 14.1 g/dL (ref 12.0–15.0)
MCHC: 33.2 g/dL (ref 30.0–36.0)
MCV: 83.9 fl (ref 78.0–100.0)
Platelets: 259 10*3/uL (ref 150.0–400.0)
RBC: 5.04 Mil/uL (ref 3.87–5.11)
RDW: 13 % (ref 11.5–15.5)
WBC: 5.8 10*3/uL (ref 4.0–10.5)

## 2021-08-03 LAB — COMPREHENSIVE METABOLIC PANEL
ALT: 10 U/L (ref 0–35)
AST: 12 U/L (ref 0–37)
Albumin: 4.3 g/dL (ref 3.5–5.2)
Alkaline Phosphatase: 78 U/L (ref 39–117)
BUN: 14 mg/dL (ref 6–23)
CO2: 28 mEq/L (ref 19–32)
Calcium: 9 mg/dL (ref 8.4–10.5)
Chloride: 104 mEq/L (ref 96–112)
Creatinine, Ser: 0.84 mg/dL (ref 0.40–1.20)
GFR: 81.34 mL/min (ref >60.00)
Glucose, Bld: 91 mg/dL (ref 70–99)
Potassium: 4.3 mEq/L (ref 3.5–5.1)
Sodium: 139 mEq/L (ref 135–145)
Total Bilirubin: 0.5 mg/dL (ref 0.2–1.2)
Total Protein: 6.8 g/dL (ref 6.0–8.3)

## 2021-08-03 LAB — LIPID PANEL
Cholesterol: 234 mg/dL — ABNORMAL HIGH (ref 0–200)
HDL: 65 mg/dL (ref >39.00)
LDL Cholesterol: 151 mg/dL — ABNORMAL HIGH (ref 0–99)
NonHDL: 168.52
Total CHOL/HDL Ratio: 4
Triglycerides: 86 mg/dL (ref 0.0–149.0)
VLDL: 17.2 mg/dL (ref 0.0–40.0)

## 2021-08-03 LAB — VITAMIN D 25 HYDROXY (VIT D DEFICIENCY, FRACTURES): VITD: 23.57 ng/mL — ABNORMAL LOW (ref 30.00–100.00)

## 2021-08-03 LAB — HEMOGLOBIN A1C: Hgb A1c MFr Bld: 5.6 % (ref 4.6–6.5)

## 2021-08-03 NOTE — Patient Instructions (Signed)
Stop by the lab prior to leaving today. I will notify you of your results once received.  ? ?It was a pleasure to see you today! ? ?Preventive Care 40-49 Years Old, Female ?Preventive care refers to lifestyle choices and visits with your health care provider that can promote health and wellness. Preventive care visits are also called wellness exams. ?What can I expect for my preventive care visit? ?Counseling ?Your health care provider may ask you questions about your: ?Medical history, including: ?Past medical problems. ?Family medical history. ?Pregnancy history. ?Current health, including: ?Menstrual cycle. ?Method of birth control. ?Emotional well-being. ?Home life and relationship well-being. ?Sexual activity and sexual health. ?Lifestyle, including: ?Alcohol, nicotine or tobacco, and drug use. ?Access to firearms. ?Diet, exercise, and sleep habits. ?Work and work environment. ?Sunscreen use. ?Safety issues such as seatbelt and bike helmet use. ?Physical exam ?Your health care provider will check your: ?Height and weight. These may be used to calculate your BMI (body mass index). BMI is a measurement that tells if you are at a healthy weight. ?Waist circumference. This measures the distance around your waistline. This measurement also tells if you are at a healthy weight and may help predict your risk of certain diseases, such as type 2 diabetes and high blood pressure. ?Heart rate and blood pressure. ?Body temperature. ?Skin for abnormal spots. ?What immunizations do I need? ?Vaccines are usually given at various ages, according to a schedule. Your health care provider will recommend vaccines for you based on your age, medical history, and lifestyle or other factors, such as travel or where you work. ?What tests do I need? ?Screening ?Your health care provider may recommend screening tests for certain conditions. This may include: ?Lipid and cholesterol levels. ?Diabetes screening. This is done by checking  your blood sugar (glucose) after you have not eaten for a while (fasting). ?Pelvic exam and Pap test. ?Hepatitis B test. ?Hepatitis C test. ?HIV (human immunodeficiency virus) test. ?STI (sexually transmitted infection) testing, if you are at risk. ?Lung cancer screening. ?Colorectal cancer screening. ?Mammogram. Talk with your health care provider about when you should start having regular mammograms. This may depend on whether you have a family history of breast cancer. ?BRCA-related cancer screening. This may be done if you have a family history of breast, ovarian, tubal, or peritoneal cancers. ?Bone density scan. This is done to screen for osteoporosis. ?Talk with your health care provider about your test results, treatment options, and if necessary, the need for more tests. ?Follow these instructions at home: ?Eating and drinking ? ?Eat a diet that includes fresh fruits and vegetables, whole grains, lean protein, and low-fat dairy products. ?Take vitamin and mineral supplements as recommended by your health care provider. ?Do not drink alcohol if: ?Your health care provider tells you not to drink. ?You are pregnant, may be pregnant, or are planning to become pregnant. ?If you drink alcohol: ?Limit how much you have to 0-1 drink a day. ?Know how much alcohol is in your drink. In the [place] one drink equals one 12 oz bottle of beer (355 mL), one 5 oz glass of wine (148 mL), or one 1? oz glass of hard liquor (44 mL). ?Lifestyle ?Brush your teeth every morning and night with fluoride toothpaste. Floss one time each day. ?Exercise for at least 30 minutes 5 or more days each week. ?Do not use any products that contain nicotine or tobacco. These products include cigarettes, chewing tobacco, and vaping devices, such as e-cigarettes. If you need help   quitting, ask your health care provider. ?Do not use drugs. ?If you are sexually active, practice safe sex. Use a condom or other form of protection to prevent STIs. ?If you  do not wish to become pregnant, use a form of birth control. If you plan to become pregnant, see your health care provider for a prepregnancy visit. ?Take aspirin only as told by your health care provider. Make sure that you understand how much to take and what form to take. Work with your health care provider to find out whether it is safe and beneficial for you to take aspirin daily. ?Find healthy ways to manage stress, such as: ?Meditation, yoga, or listening to music. ?Journaling. ?Talking to a trusted person. ?Spending time with friends and family. ?Minimize exposure to UV radiation to reduce your risk of skin cancer. ?Safety ?Always wear your seat belt while driving or riding in a vehicle. ?Do not drive: ?If you have been drinking alcohol. Do not ride with someone who has been drinking. ?When you are tired or distracted. ?While texting. ?If you have been using any mind-altering substances or drugs. ?Wear a helmet and other protective equipment during sports activities. ?If you have firearms in your house, make sure you follow all gun safety procedures. ?Seek help if you have been physically or sexually abused. ?What's next? ?Visit your health care provider once a year for an annual wellness visit. ?Ask your health care provider how often you should have your eyes and teeth checked. ?Stay up to date on all vaccines. ?This information is not intended to replace advice given to you by your health care provider. Make sure you discuss any questions you have with your health care provider. ?Document Revised: 02/24/2021 Document Reviewed: 02/24/2021 ?Elsevier Patient Education ? 2022 Elsevier Inc. ? ?

## 2021-08-03 NOTE — Progress Notes (Signed)
Subjective:    Patient ID: Angela Snow, female    DOB: 1971/08/15, 50 y.o.   MRN: [id number]  HPI  Angela Snow is a very pleasant 50 y.o. female who presents today for complete physical and follow up of chronic conditions.  Immunizations: -Tetanus: 2017 -Influenza: Completed this season  -Covid-19: 2 vaccines   Diet: Fair diet.  Exercise: No regular exercise.  Eye exam: Completes annually  Dental exam: Completes semi-annually   Pap Smear: Completed, follows with GYN.  Mammogram: Completed in November 2021, scheduled for 11/28. Colonoscopy: Completed in 2020, due 2025  BP Readings from Last 3 Encounters:  08/03/21 108/80  07/31/20 110/74  10/30/19 110/80    Wt Readings from Last 3 Encounters:  08/03/21 195 lb 6 oz (88.6 kg)  07/31/20 185 lb (83.9 kg)  10/30/19 185 lb 12 oz (84.3 kg)       Review of Systems  Constitutional:  Negative for unexpected weight change.  HENT:  Negative for rhinorrhea.   Respiratory:  Negative for cough and shortness of breath.   Cardiovascular:  Negative for chest pain.  Gastrointestinal:  Negative for constipation and diarrhea.  Genitourinary:  Negative for difficulty urinating.  Musculoskeletal:  Negative for arthralgias and myalgias.  Skin:  Negative for rash.  Allergic/Immunologic: Negative for environmental allergies.  Neurological:  Negative for dizziness and headaches.  Psychiatric/Behavioral:  The patient is not nervous/anxious.         Past Medical History:  Diagnosis Date   History of UTI    TIA (transient ischemic attack)    atrial septal defect    Social History   Socioeconomic History   Marital status: Married    Spouse name: Not on file   Number of children: Not on file   Years of education: Not on file   Highest education level: Not on file  Occupational History   Not on file  Tobacco Use   Smoking status: Former    Years: 10.00    Types: Cigarettes    Quit date: 2010    Years since quitting:  12.8   Smokeless tobacco: Never  Vaping Use   Vaping Use: Never used  Substance and Sexual Activity   Alcohol use: No    Alcohol/week: 0.0 standard drinks   Drug use: Never   Sexual activity: Not on file  Other Topics Concern   Not on file  Social History Narrative   Married.   2 children.   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   Enjoys spending time with family.   Social Determinants of Health   Financial Resource Strain: Not on file  Food Insecurity: Not on file  Transportation Needs: Not on file  Physical Activity: Not on file  Stress: Not on file  Social Connections: Not on file  Intimate Partner Violence: Not on file    Past Surgical History:  Procedure Laterality Date   ASD [place]     POLYPECTOMY  2016   uterine    Family History  Problem Relation Age of Onset   Colon cancer Maternal Grandfather    Hypertension Father    Hyperlipidemia Father    Hyperlipidemia Mother    Breast cancer Neg Hx    Colon polyps Neg Hx    Esophageal cancer Neg Hx    Rectal cancer Neg Hx    Stomach  cancer Neg Hx     Allergies  Allergen Reactions   Penicillins Rash    Current Outpatient Medications on File Prior to Visit  Medication Sig Dispense Refill   cholecalciferol (VITAMIN D) 25 MCG (1000 UNIT) tablet Take 1 tablet by mouth daily at 12 noon.     estradiol-norethindrone (ACTIVELLA) 1-0.5 MG tablet Take 1 tablet daily 84 tablet 4   No current facility-administered medications on file prior to visit.    BP 108/80   Pulse 82   Temp (!) 97 F (36.1 C) (Temporal)   Ht 5' 4.75" (1.645 m)   Wt 195 lb 6 oz (88.6 kg)   LMP 06/29/2017   SpO2 97%   BMI 32.76 kg/m  Objective:   Physical Exam HENT:     Right Ear: Tympanic membrane and ear canal normal.     Left Ear: Tympanic membrane and ear canal normal.     Nose: Nose normal.  Eyes:     Conjunctiva/sclera:  Conjunctivae normal.     Pupils: Pupils are equal, round, and reactive to light.  Neck:     Thyroid: No thyromegaly.  Cardiovascular:     Rate and Rhythm: Normal rate and regular rhythm.     Heart sounds: No murmur heard. Pulmonary:     Effort: Pulmonary effort is normal.     Breath sounds: Normal breath sounds. No rales.  Abdominal:     General: Bowel sounds are normal.     Palpations: Abdomen is soft.     Tenderness: There is no abdominal tenderness.  Musculoskeletal:        General: Normal range of motion.     Cervical back: Neck supple.  Lymphadenopathy:     Cervical: No cervical adenopathy.  Skin:    General: Skin is warm and dry.     Findings: No rash.  Neurological:     Mental Status: She is alert and oriented to person, place, and time.     Cranial Nerves: No cranial nerve deficit.     Deep Tendon Reflexes: Reflexes are normal and symmetric.  Psychiatric:        Mood and Affect: Mood normal.          Assessment & Plan:      This visit occurred during the SARS-CoV-2 public health emergency.  Safety protocols were in place, including screening questions prior to the visit, additional usage of staff PPE, and extensive cleaning of exam room while observing appropriate contact time as indicated for disinfecting solutions.

## 2021-08-03 NOTE — Assessment & Plan Note (Signed)
Compliant to vitamin D 1000 IU daily, continue same.  Repeat level pending.

## 2021-08-03 NOTE — Assessment & Plan Note (Signed)
Stable.  No concerns today. Continue to monitor.  

## 2021-08-03 NOTE — Assessment & Plan Note (Signed)
Immunizations UTD. Pap smear UTD, follows with GYN. Colonoscopy UTD, due 2025.  Discussed the importance of a healthy diet and regular exercise in order for weight loss, and to reduce the risk of further co-morbidity.  Exam today stable. Labs pending

## 2021-08-04 ENCOUNTER — Encounter: Payer: Self-pay | Admitting: Primary Care

## 2021-08-09 ENCOUNTER — Ambulatory Visit
Admission: RE | Admit: 2021-08-09 | Discharge: 2021-08-09 | Disposition: A | Discharge status: Home / Self Care | Payer: 59 | Source: Ambulatory Visit | Attending: Obstetrics and Gynecology | Admitting: Obstetrics and Gynecology

## 2021-08-09 DIAGNOSIS — Z1231 Encounter for screening mammogram for malignant neoplasm of breast: Secondary | ICD-10-CM

## 2021-08-15 ENCOUNTER — Telehealth: Payer: 59 | Admitting: Family

## 2021-08-15 DIAGNOSIS — B9689 Other specified bacterial agents as the cause of diseases classified elsewhere: Secondary | ICD-10-CM | POA: Diagnosis not present

## 2021-08-15 DIAGNOSIS — J208 Acute bronchitis due to other specified organisms: Secondary | ICD-10-CM | POA: Diagnosis not present

## 2021-08-15 MED ORDER — BENZONATATE 100 MG PO CAPS: 100.0000 mg | ORAL_CAPSULE | Freq: Three times a day (TID) | ORAL | 0 refills | Status: DC | PRN

## 2021-08-15 MED ORDER — DOXYCYCLINE HYCLATE 100 MG PO TABS: 100.0000 mg | ORAL_TABLET | Freq: Two times a day (BID) | ORAL | 0 refills | Status: DC

## 2021-08-15 NOTE — Progress Notes (Signed)

## 2021-08-26 DIAGNOSIS — Z6835 Body mass index (BMI) 35.0-35.9, adult: Secondary | ICD-10-CM | POA: Diagnosis not present

## 2021-08-26 DIAGNOSIS — Z130 Encounter for screening for diseases of the blood and blood-forming organs and certain disorders involving the immune mechanism: Secondary | ICD-10-CM | POA: Diagnosis not present

## 2021-08-26 DIAGNOSIS — Z1389 Encounter for screening for other disorder: Secondary | ICD-10-CM | POA: Diagnosis not present

## 2021-08-26 DIAGNOSIS — Z01419 Encounter for gynecological examination (general) (routine) without abnormal findings: Secondary | ICD-10-CM | POA: Diagnosis not present

## 2021-08-26 DIAGNOSIS — N951 Menopausal and female climacteric states: Secondary | ICD-10-CM | POA: Diagnosis not present

## 2021-09-13 ENCOUNTER — Telehealth: Payer: 59 | Admitting: Physician Assistant

## 2021-09-13 DIAGNOSIS — J208 Acute bronchitis due to other specified organisms: Secondary | ICD-10-CM

## 2021-09-13 DIAGNOSIS — B9689 Other specified bacterial agents as the cause of diseases classified elsewhere: Secondary | ICD-10-CM

## 2021-09-13 MED ORDER — BENZONATATE 100 MG PO CAPS: 100.0000 mg | ORAL_CAPSULE | Freq: Three times a day (TID) | ORAL | 0 refills | Status: DC | PRN

## 2021-09-13 MED ORDER — AZITHROMYCIN 250 MG PO TABS: ORAL_TABLET | ORAL | 0 refills | Status: AC

## 2021-09-13 NOTE — Progress Notes (Signed)
I have spent 5 minutes in review of e-visit questionnaire, review and updating patient chart, medical decision making and response to patient.   Rishan Oyama Cody Keri Tavella, PA-C    

## 2021-09-13 NOTE — Progress Notes (Signed)
We are sorry that you are not feeling well.  Here is how we plan to help!  Based on your presentation I believe you most likely have A cough due to bacteria.  When patients have a productive cough with a change in color or increased sputum production, or recurrent cough and congestion after initial improvement/resolution, we are concerned about bacterial bronchitis.  If left untreated it can progress to pneumonia.  If your symptoms do not improve with your treatment plan it is important that you contact your provider.   I have prescribed Azithromyin 250 mg: two tablets now and then one tablet daily for 4 additonal days    In addition you may use A prescription cough medication called Tessalon Perles 100mg . You may take 1-2 capsules every 8 hours as needed for your cough.   From your responses in the eVisit questionnaire you describe inflammation in the upper respiratory tract which is causing a significant cough.  This is commonly called Bronchitis and has four common causes:   Allergies Viral Infections Acid Reflux Bacterial Infection Allergies, viruses and acid reflux are treated by controlling symptoms or eliminating the cause. An example might be a cough caused by taking certain blood pressure medications. You stop the cough by changing the medication. Another example might be a cough caused by acid reflux. Controlling the reflux helps control the cough.  USE OF BRONCHODILATOR ("RESCUE") INHALERS: There is a risk from using your bronchodilator too frequently.  The risk is that over-reliance on a medication which only relaxes the muscles surrounding the breathing tubes can reduce the effectiveness of medications prescribed to reduce swelling and congestion of the tubes themselves.  Although you feel brief relief from the bronchodilator inhaler, your asthma may actually be worsening with the tubes becoming more swollen and filled with mucus.  This can delay other crucial treatments, such as oral  steroid medications. If you need to use a bronchodilator inhaler daily, several times per day, you should discuss this with your provider.  There are probably better treatments that could be used to keep your asthma under control.     HOME CARE Only take medications as instructed by your medical team. Complete the entire course of an antibiotic. Drink plenty of fluids and get plenty of rest. Avoid close contacts especially the very young and the elderly Cover your mouth if you cough or cough into your sleeve. Always remember to wash your hands A steam or ultrasonic humidifier can help congestion.   GET HELP RIGHT AWAY IF: You develop worsening fever. You become short of breath You cough up blood. Your symptoms persist after you have completed your treatment plan MAKE SURE YOU  Understand these instructions. Will watch your condition. Will get help right away if you are not doing well or get worse.    Thank you for choosing an e-visit.  Your e-visit answers were reviewed by a board certified advanced clinical practitioner to complete your personal care plan. Depending upon the condition, your plan could have included both over the counter or prescription medications.  Please review your pharmacy choice. Make sure the pharmacy is open so you can pick up prescription now. If there is a problem, you may contact your provider through [employer] and have the prescription routed to another pharmacy.  Your safety is important to [place]. If you have drug allergies check your prescription carefully.   For the next 24 hours you can use MyChart to ask questions about today's visit, request a  non-urgent call back, or ask for a work or school excuse. You will get an email in the next two days asking about your experience. I hope that your e-visit has been valuable and will speed your recovery.

## 2021-09-24 ENCOUNTER — Telehealth (INDEPENDENT_AMBULATORY_CARE_PROVIDER_SITE_OTHER): Payer: 59 | Admitting: Primary Care

## 2021-09-24 ENCOUNTER — Ambulatory Visit
Admission: RE | Admit: 2021-09-24 | Discharge: 2021-09-24 | Disposition: A | Discharge status: Home / Self Care | Payer: 59 | Attending: Internal Medicine | Admitting: Internal Medicine

## 2021-09-24 ENCOUNTER — Ambulatory Visit
Admission: RE | Admit: 2021-09-24 | Discharge: 2021-09-24 | Disposition: A | Discharge status: Home / Self Care | Payer: 59 | Source: Ambulatory Visit | Attending: Primary Care | Admitting: Primary Care

## 2021-09-24 ENCOUNTER — Encounter: Payer: Self-pay | Admitting: Primary Care

## 2021-09-24 VITALS — Ht 64.75 in | Wt 195.0 lb

## 2021-09-24 DIAGNOSIS — R053 Chronic cough: Secondary | ICD-10-CM

## 2021-09-24 DIAGNOSIS — R059 Cough, unspecified: Secondary | ICD-10-CM | POA: Diagnosis not present

## 2021-09-24 HISTORY — DX: Chronic cough: R05.3

## 2021-09-24 MED ORDER — OMEPRAZOLE 40 MG PO CPDR: 40.0000 mg | DELAYED_RELEASE_CAPSULE | Freq: Every day | ORAL | 0 refills | Status: DC

## 2021-09-24 NOTE — Patient Instructions (Signed)
Complete the xray as discussed.  Start omeprazole 40 mg for cough. Take 1 capsule every night.  Start an antihistamine such as Claritin/Allegra/Zyrtec once daily.  Please update me in 1 week!  It was a pleasure to see you today!

## 2021-09-24 NOTE — Assessment & Plan Note (Signed)
Induced post viral infection.  Differentials include allergies, GERD, reactive airway.  Checking chest xray today.  Start omeprazole 40 mg nightly. Add antihistamine daily.  She will update in 1 week.

## 2021-09-24 NOTE — Telephone Encounter (Signed)
Will you schedule for today/

## 2021-09-24 NOTE — Progress Notes (Signed)
Patient ID: Angela Snow, female    DOB: 09-Apr-1971, 51 y.o.   MRN: [id number]  Virtual visit completed through [employer], a video enabled telemedicine application. Due to national recommendations of social distancing due to COVID-19, a virtual visit is felt to be most appropriate for this patient at this time. Reviewed limitations, risks, security and privacy concerns of performing a virtual visit and the availability of in person appointments. I also reviewed that there may be a patient responsible charge related to this service. The patient agreed to proceed.   Patient location: home Provider location: [place] at [facility], office Persons participating in this virtual visit: patient, provider   If any vitals were documented, they were collected by patient at home unless specified below.    Ht 5' 4.75" (1.645 m)    Wt 195 lb (88.5 kg)    LMP 06/29/2017    BMI 32.70 kg/m    CC: Cough Subjective:   HPI: Angela Snow is a 51 y.o. female with a history of urinary incontinence presenting on 09/24/2021 for chronic cough.   Chronic cough since mid November 2022. At the time she was evaluated by an E-Visit through [facility] for 3 day history of cough. She was diagnosed with viral URI and treated with prednisone 10 mg x 6 days. She improved some after treatment.  Evaluated for CPE in late November 2022 with me, symptoms had improved.  Evaluated again on 08/15/21 on an E-Visit through [facility] for continued but worsening cough since mid November. She was diagnosed with a bacterial infection and treated with Doxycycline 100 mg BID x 7 days, Tessalon Perles 100 mg, Mucinex, and Robitussin. She improved some after treatment, no resolve.  Evaluated again on 09/13/21 through an E-Visit through [facility] for ongoing cough. Treated for bacterial infection and treated with azithromycin 250 mg tablets for a five day course. She improved some after treatment, no resolve.   Today she  endorses continued cough, sounds congested but nothing comes up. Waxes and wanes, felt temporary improvement after each course of treatment since November 2022, no resolve. Symptoms now include intermittent audible "wheezing", cough that is worse throughout the day, does cough at night but doesn't cause her to lose sleep. She does get "short winded" when walking up a flight of stairs, has gained a lot of weight last year.   She denies fevers, production to cough, post nasal drip, rhinorrhea. She does have a history of GERD, but hasn't noticed active symptoms.   She denies a history of asthma. She is a prior smoker, smoked 10 years off and on from the age of 82 to 72. Quit 10 years ago.       Relevant past medical, surgical, family and social history reviewed and updated as indicated. Interim medical history since our last visit reviewed. Allergies and medications reviewed and updated. Outpatient Medications Prior to Visit  Medication Sig Dispense Refill   benzonatate (TESSALON) 100 MG capsule Take 1 capsule (100 mg total) by mouth 3 (three) times daily as needed for cough. 30 capsule 0   cholecalciferol (VITAMIN D) 25 MCG (1000 UNIT) tablet Take 1 tablet by mouth daily at 12 noon.     estradiol-norethindrone (ACTIVELLA) 1-0.5 MG tablet Take 1 tablet daily 84 tablet 4   No facility-administered medications prior to visit.     Per HPI unless specifically indicated in ROS section below Review of Systems  Constitutional:  Negative for chills and fever.  HENT:  Negative for congestion, postnasal drip, rhinorrhea, sinus pressure and sore throat.   Respiratory:  Positive for cough. Negative for shortness of breath.   Objective:  Ht 5' 4.75" (1.645 m)    Wt 195 lb (88.5 kg)    LMP 06/29/2017    BMI 32.70 kg/m   Wt Readings from Last 3 Encounters:  09/24/21 195 lb (88.5 kg)  08/03/21 195 lb 6 oz (88.6 kg)  07/31/20 185 lb (83.9 kg)       Physical exam: General: Alert and oriented x 3, no  distress, does not appear sickly  Pulmonary: Speaks in complete sentences without increased work of breathing, congested sounding cough occurred once during visit.   Psychiatric: Normal mood, thought content, and behavior.     Results for orders placed or performed in visit on 08/04/21  HM PAP SMEAR  Result Value Ref Range   HM Pap smear see report   Results Console HPV  Result Value Ref Range   CHL HPV Negative    Assessment & Plan:   Problem List Items Addressed This Visit   None Visit Diagnoses     Persistent cough for 3 weeks or longer    -  Primary   Relevant Medications   omeprazole (PRILOSEC) 40 MG capsule   Other Relevant Orders   DG Chest 2 View        Meds ordered this encounter  Medications   omeprazole (PRILOSEC) 40 MG capsule    Sig: Take 1 capsule (40 mg total) by mouth daily.    Dispense:  30 capsule    Refill:  0    Order Specific Question:   Supervising Provider    Answer:   Diona Browner, AMY E [[id number]]   Orders Placed This Encounter  Procedures   DG Chest 18 View    51 year old female with persistent cough since November 2022. Prior smoker.    Standing Status:   Future    Standing Expiration Date:   09/24/2022    Order Specific Question:   Reason for Exam (SYMPTOM  OR DIAGNOSIS REQUIRED)    Answer:   persistent cough    Order Specific Question:   Preferred imaging location?    Answer:   [facility]    Order Specific Question:   Radiology Contrast Protocol - do NOT remove file path    Answer:   [email or web address]    I discussed the assessment and treatment plan with the patient. The patient was provided an opportunity to ask questions and all were answered. The patient agreed with the plan and demonstrated an understanding of the instructions. The patient was advised to call back or seek an in-person evaluation if the symptoms worsen or if the condition fails to improve as anticipated.  Follow up  plan:  Complete the xray as discussed.  Start omeprazole 40 mg for cough. Take 1 capsule every night.  Start an antihistamine such as Claritin/Allegra/Zyrtec once daily.  Please update me in 1 week!  It was a pleasure to see you today!   Pleas Koch, NP

## 2021-09-24 NOTE — Telephone Encounter (Signed)
Added to schedule.

## 2021-11-05 ENCOUNTER — Other Ambulatory Visit: Payer: Self-pay

## 2022-04-05 ENCOUNTER — Other Ambulatory Visit: Payer: Self-pay

## 2022-07-18 ENCOUNTER — Other Ambulatory Visit: Payer: Self-pay | Admitting: Obstetrics and Gynecology

## 2022-07-18 DIAGNOSIS — Z1231 Encounter for screening mammogram for malignant neoplasm of breast: Secondary | ICD-10-CM

## 2022-08-09 ENCOUNTER — Encounter: Payer: Self-pay | Admitting: Primary Care

## 2022-08-09 ENCOUNTER — Ambulatory Visit (INDEPENDENT_AMBULATORY_CARE_PROVIDER_SITE_OTHER): Payer: 59 | Admitting: Primary Care

## 2022-08-09 VITALS — BP 122/82 | HR 73 | Temp 98.8°F | Ht 64.25 in | Wt 182.0 lb

## 2022-08-09 DIAGNOSIS — E559 Vitamin D deficiency, unspecified: Secondary | ICD-10-CM | POA: Diagnosis not present

## 2022-08-09 DIAGNOSIS — Z0000 Encounter for general adult medical examination without abnormal findings: Secondary | ICD-10-CM

## 2022-08-09 DIAGNOSIS — Z23 Encounter for immunization: Secondary | ICD-10-CM

## 2022-08-09 DIAGNOSIS — E785 Hyperlipidemia, unspecified: Secondary | ICD-10-CM | POA: Insufficient documentation

## 2022-08-09 LAB — COMPREHENSIVE METABOLIC PANEL
ALT: 13 U/L (ref 0–35)
AST: 14 U/L (ref 0–37)
Albumin: 4.4 g/dL (ref 3.5–5.2)
Alkaline Phosphatase: 74 U/L (ref 39–117)
BUN: 16 mg/dL (ref 6–23)
CO2: 28 mEq/L (ref 19–32)
Calcium: 9.2 mg/dL (ref 8.4–10.5)
Chloride: 103 mEq/L (ref 96–112)
Creatinine, Ser: 0.71 mg/dL (ref 0.40–1.20)
GFR: 98.82 mL/min (ref >60.00)
Glucose, Bld: 91 mg/dL (ref 70–99)
Potassium: 3.8 mEq/L (ref 3.5–5.1)
Sodium: 140 mEq/L (ref 135–145)
Total Bilirubin: 0.4 mg/dL (ref 0.2–1.2)
Total Protein: 7.3 g/dL (ref 6.0–8.3)

## 2022-08-09 LAB — LIPID PANEL
Cholesterol: 239 mg/dL — ABNORMAL HIGH (ref 0–200)
HDL: 75.5 mg/dL (ref >39.00)
LDL Cholesterol: 154 mg/dL — ABNORMAL HIGH (ref 0–99)
NonHDL: 163.77
Total CHOL/HDL Ratio: 3
Triglycerides: 48 mg/dL (ref 0.0–149.0)
VLDL: 9.6 mg/dL (ref 0.0–40.0)

## 2022-08-09 LAB — VITAMIN D 25 HYDROXY (VIT D DEFICIENCY, FRACTURES): VITD: 32.25 ng/mL (ref 30.00–100.00)

## 2022-08-09 NOTE — Assessment & Plan Note (Signed)
Noted on prior labs, also with strong family history of hyperlipidemia.  Repeat lipid panel pending.  Discussed the importance of a healthy diet and regular exercise in order for weight loss, and to reduce the risk of further co-morbidity.

## 2022-08-09 NOTE — Progress Notes (Signed)
Subjective:    Patient ID: Angela Snow, female    DOB: Dec 24, 1970, 51 y.o.   MRN: [id number]  HPI  Angela Snow is a very pleasant 51 y.o. female who presents today for complete physical and follow up of chronic conditions.  Immunizations: -Tetanus: 2017 -Influenza: Completed this season  -Shingles: Never completed  Diet: Fair diet.  Exercise: No regular exercise.   Eye exam: Completed >1 year ago.  Dental exam: Completes semi-annually   Pap Smear: Completed with GYN Mammogram: Completed in November 2022, scheduled for January 2024  Colonoscopy: Completed in 2020, due 2025   BP Readings from Last 3 Encounters:  08/09/22 122/82  08/03/21 108/80  07/31/20 110/74        Review of Systems  Constitutional:  Negative for unexpected weight change.  HENT:  Negative for rhinorrhea.   Respiratory:  Negative for cough and shortness of breath.   Cardiovascular:  Negative for chest pain.  Gastrointestinal:  Negative for constipation and diarrhea.  Genitourinary:  Negative for difficulty urinating.  Musculoskeletal:  Negative for arthralgias and myalgias.  Skin:  Negative for rash.  Allergic/Immunologic: Negative for environmental allergies.  Neurological:  Negative for dizziness and headaches.  Psychiatric/Behavioral:  The patient is not nervous/anxious.          Past Medical History:  Diagnosis Date   History of UTI    Persistent cough for 3 weeks or longer 09/24/2021   TIA (transient ischemic attack)    atrial septal defect    Social History   Socioeconomic History   Marital status: Married    Spouse name: Not on file   Number of children: Not on file   Years of education: Not on file   Highest education level: Not on file  Occupational History   Not on file  Tobacco Use   Smoking status: Former    Years: 10.00    Types: Cigarettes    Quit date: 2010    Years since quitting: 13.9   Smokeless tobacco: Never  Vaping Use   Vaping Use: Never used   Substance and Sexual Activity   Alcohol use: No    Alcohol/week: 0.0 standard drinks of alcohol   Drug use: Never   Sexual activity: Not on file  Other Topics Concern   Not on file  Social History Narrative   Married.   2 children.   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   Enjoys spending time with family.   Social Determinants of Health   Financial Resource Strain: Not on file  Food Insecurity: Not on file  Transportation Needs: Not on file  Physical Activity: Not on file  Stress: Not on file  Social Connections: Not on file  Intimate Partner Violence: Not on file    Past Surgical History:  Procedure Laterality Date   ASD [place]     POLYPECTOMY  2016   uterine    Family History  Problem Relation Age of Onset   Hyperlipidemia Mother    Hypertension Father    Hyperlipidemia Father    Stroke Father    Colon cancer Maternal Grandfather    Heart attack Paternal Grandfather    Heart attack Paternal Aunt    Breast cancer Neg Hx    Colon polyps Neg Hx    Esophageal cancer Neg Hx    Rectal cancer Neg  Hx    Stomach cancer Neg Hx     Allergies  Allergen Reactions   Penicillins Rash    Current Outpatient Medications on File Prior to Visit  Medication Sig Dispense Refill   cholecalciferol (VITAMIN D) 25 MCG (1000 UNIT) tablet Take 1 tablet by mouth daily at 12 noon.     No current facility-administered medications on file prior to visit.    BP 122/82   Pulse 73   Temp 98.8 F (37.1 C) (Temporal)   Ht 5' 4.25" (1.632 m)   Wt 182 lb (82.6 kg)   LMP 06/20/2017   SpO2 98%   BMI 31.00 kg/m  Objective:   Physical Exam HENT:     Right Ear: Tympanic membrane and ear canal normal.     Left Ear: Tympanic membrane and ear canal normal.     Nose: Nose normal.  Eyes:     Conjunctiva/sclera: Conjunctivae normal.     Pupils: Pupils are equal, round, and  reactive to light.  Neck:     Thyroid: No thyromegaly.  Cardiovascular:     Rate and Rhythm: Normal rate and regular rhythm.     Heart sounds: No murmur heard. Pulmonary:     Effort: Pulmonary effort is normal.     Breath sounds: Normal breath sounds. No rales.  Abdominal:     General: Bowel sounds are normal.     Palpations: Abdomen is soft.     Tenderness: There is no abdominal tenderness.  Musculoskeletal:        General: Normal range of motion.     Cervical back: Neck supple.  Lymphadenopathy:     Cervical: No cervical adenopathy.  Skin:    General: Skin is warm and dry.     Findings: No rash.  Neurological:     Mental Status: She is alert and oriented to person, place, and time.     Cranial Nerves: No cranial nerve deficit.     Deep Tendon Reflexes: Reflexes are normal and symmetric.  Psychiatric:        Mood and Affect: Mood normal.           Assessment & Plan:   Problem List Items Addressed This Visit       Other   Preventative health care - Primary    Shingrix due, first dose provided today. Instructions provided for second dose. Pap smear UTD, follows with GYN. Colonoscopy UTD, due 2025. Mammogram scheduled.  Discussed the importance of a healthy diet and regular exercise in order for weight loss, and to reduce the risk of further co-morbidity.  Exam stable. Labs pending.  Follow up in 1 year for repeat physical.       Vitamin D deficiency    Continue 2000 IU daily. She is not as consistent with use.  Repeat vitamin D level pending.      Relevant Orders   VITAMIN D 25 Hydroxy (Vit-D Deficiency, Fractures)   Hyperlipidemia    Noted on prior labs, also with strong family history of hyperlipidemia.  Repeat lipid panel pending.  Discussed the importance of a healthy diet and regular exercise in order for weight loss, and to reduce the risk of further co-morbidity.       Relevant Orders   Lipid panel   Comprehensive metabolic panel        Pleas Koch, NP

## 2022-08-09 NOTE — Assessment & Plan Note (Signed)
Shingrix due, first dose provided today. Instructions provided for second dose. Pap smear UTD, follows with GYN. Colonoscopy UTD, due 2025. Mammogram scheduled.  Discussed the importance of a healthy diet and regular exercise in order for weight loss, and to reduce the risk of further co-morbidity.  Exam stable. Labs pending.  Follow up in 1 year for repeat physical.

## 2022-08-09 NOTE — Assessment & Plan Note (Signed)
Continue 2000 IU daily. She is not as consistent with use.  Repeat vitamin D level pending.

## 2022-08-09 NOTE — Patient Instructions (Addendum)
Stop by the lab prior to leaving today. I will notify you of your results once received.   Schedule a nurse visit to receive the second Shingles vaccine in 2-6 months.  It was a pleasure to see you today!  Preventive Care 3-50 Years Old, Female Preventive care refers to lifestyle choices and visits with your health care provider that can promote health and wellness. Preventive care visits are also called wellness exams. What can I expect for my preventive care visit? Counseling Your health care provider may ask you questions about your: Medical history, including: Past medical problems. Family medical history. Pregnancy history. Current health, including: Menstrual cycle. Method of birth control. Emotional well-being. Home life and relationship well-being. Sexual activity and sexual health. Lifestyle, including: Alcohol, nicotine or tobacco, and drug use. Access to firearms. Diet, exercise, and sleep habits. Work and work Statistician. Sunscreen use. Safety issues such as seatbelt and bike helmet use. Physical exam Your health care provider will check your: Height and weight. These may be used to calculate your BMI (body mass index). BMI is a measurement that tells if you are at a healthy weight. Waist circumference. This measures the distance around your waistline. This measurement also tells if you are at a healthy weight and may help predict your risk of certain diseases, such as type 2 diabetes and high blood pressure. Heart rate and blood pressure. Body temperature. Skin for abnormal spots. What immunizations do I need?  Vaccines are usually given at various ages, according to a schedule. Your health care provider will recommend vaccines for you based on your age, medical history, and lifestyle or other factors, such as travel or where you work. What tests do I need? Screening Your health care provider may recommend screening tests for certain conditions. This may  include: Lipid and cholesterol levels. Diabetes screening. This is done by checking your blood sugar (glucose) after you have not eaten for a while (fasting). Pelvic exam and Pap test. Hepatitis B test. Hepatitis C test. HIV (human immunodeficiency virus) test. STI (sexually transmitted infection) testing, if you are at risk. Lung cancer screening. Colorectal cancer screening. Mammogram. Talk with your health care provider about when you should start having regular mammograms. This may depend on whether you have a family history of breast cancer. BRCA-related cancer screening. This may be done if you have a family history of breast, ovarian, tubal, or peritoneal cancers. Bone density scan. This is done to screen for osteoporosis. Talk with your health care provider about your test results, treatment options, and if necessary, the need for more tests. Follow these instructions at home: Eating and drinking  Eat a diet that includes fresh fruits and vegetables, whole grains, lean protein, and low-fat dairy products. Take vitamin and mineral supplements as recommended by your health care provider. Do not drink alcohol if: Your health care provider tells you not to drink. You are pregnant, may be pregnant, or are planning to become pregnant. If you drink alcohol: Limit how much you have to 0-1 drink a day. Know how much alcohol is in your drink. In the [place] one drink equals one 12 oz bottle of beer (355 mL), one 5 oz glass of wine (148 mL), or one 1 oz glass of hard liquor (44 mL). Lifestyle Brush your teeth every morning and night with fluoride toothpaste. Floss one time each day. Exercise for at least 30 minutes 5 or more days each week. Do not use any products that contain nicotine or tobacco. These  products include cigarettes, chewing tobacco, and vaping devices, such as e-cigarettes. If you need help quitting, ask your health care provider. Do not use drugs. If you are sexually  active, practice safe sex. Use a condom or other form of protection to prevent STIs. If you do not wish to become pregnant, use a form of birth control. If you plan to become pregnant, see your health care provider for a prepregnancy visit. Take aspirin only as told by your health care provider. Make sure that you understand how much to take and what form to take. Work with your health care provider to find out whether it is safe and beneficial for you to take aspirin daily. Find healthy ways to manage stress, such as: Meditation, yoga, or listening to music. Journaling. Talking to a trusted person. Spending time with friends and family. Minimize exposure to UV radiation to reduce your risk of skin cancer. Safety Always wear your seat belt while driving or riding in a vehicle. Do not drive: If you have been drinking alcohol. Do not ride with someone who has been drinking. When you are tired or distracted. While texting. If you have been using any mind-altering substances or drugs. Wear a helmet and other protective equipment during sports activities. If you have firearms in your house, make sure you follow all gun safety procedures. Seek help if you have been physically or sexually abused. What's next? Visit your health care provider once a year for an annual wellness visit. Ask your health care provider how often you should have your eyes and teeth checked. Stay up to date on all vaccines. This information is not intended to replace advice given to you by your health care provider. Make sure you discuss any questions you have with your health care provider. Document Revised: 02/24/2021 Document Reviewed: 02/24/2021 Elsevier Patient Education  [place].

## 2022-08-09 NOTE — Addendum Note (Signed)
Addended by: Pat Kocher on: 08/09/2022 11:42 AM   Modules accepted: Orders

## 2022-08-16 NOTE — Telephone Encounter (Signed)
Noted  

## 2022-08-16 NOTE — Telephone Encounter (Signed)
Please contact Angela Snow and notify her that we can add her daughter on for 08/17/22 at 3:20. I believe she will be a new patient to [place].

## 2022-08-16 NOTE — Telephone Encounter (Signed)
Called and spoke with Lorenza Cambridge, she states the patient, her daughter Angela Snow is unable to get off of work to come to an appointment. Her employer is very strict about time off.  She appreciates you being willing to work her in. She states they will plan to come to their original appointment on 09/09/22.

## 2022-08-29 ENCOUNTER — Other Ambulatory Visit: Payer: Self-pay

## 2022-08-29 DIAGNOSIS — Z01411 Encounter for gynecological examination (general) (routine) with abnormal findings: Secondary | ICD-10-CM | POA: Diagnosis not present

## 2022-08-29 DIAGNOSIS — Z01419 Encounter for gynecological examination (general) (routine) without abnormal findings: Secondary | ICD-10-CM | POA: Diagnosis not present

## 2022-08-29 DIAGNOSIS — Z1389 Encounter for screening for other disorder: Secondary | ICD-10-CM | POA: Diagnosis not present

## 2022-08-29 DIAGNOSIS — L659 Nonscarring hair loss, unspecified: Secondary | ICD-10-CM | POA: Diagnosis not present

## 2022-08-29 DIAGNOSIS — N951 Menopausal and female climacteric states: Secondary | ICD-10-CM | POA: Diagnosis not present

## 2022-08-29 DIAGNOSIS — Z130 Encounter for screening for diseases of the blood and blood-forming organs and certain disorders involving the immune mechanism: Secondary | ICD-10-CM | POA: Diagnosis not present

## 2022-08-29 MED ORDER — ESTRADIOL-NORETHINDRONE ACET 1-0.5 MG PO TABS
1.0000 | ORAL_TABLET | Freq: Every day | ORAL | 4 refills | Status: DC
  Filled 2022-08-29: qty 84, 84d supply, fill #0
  Filled 2022-12-16: qty 84, 84d supply, fill #1
  Filled 2023-02-14 – 2023-03-22 (×3): qty 84, 84d supply, fill #2
  Filled 2023-06-23: qty 84, 84d supply, fill #3

## 2022-09-14 ENCOUNTER — Ambulatory Visit
Admission: RE | Admit: 2022-09-14 | Discharge: 2022-09-14 | Disposition: A | Discharge status: Home / Self Care | Payer: 59 | Source: Ambulatory Visit | Attending: Obstetrics and Gynecology | Admitting: Obstetrics and Gynecology

## 2022-09-14 DIAGNOSIS — Z1231 Encounter for screening mammogram for malignant neoplasm of breast: Secondary | ICD-10-CM

## 2022-09-27 DIAGNOSIS — H524 Presbyopia: Secondary | ICD-10-CM | POA: Diagnosis not present

## 2022-09-27 DIAGNOSIS — H5213 Myopia, bilateral: Secondary | ICD-10-CM | POA: Diagnosis not present

## 2022-09-27 DIAGNOSIS — H52203 Unspecified astigmatism, bilateral: Secondary | ICD-10-CM | POA: Diagnosis not present

## 2022-10-25 ENCOUNTER — Ambulatory Visit: Payer: Self-pay

## 2022-11-09 ENCOUNTER — Ambulatory Visit (INDEPENDENT_AMBULATORY_CARE_PROVIDER_SITE_OTHER): Payer: 59 | Admitting: *Deleted

## 2022-11-09 DIAGNOSIS — Z23 Encounter for immunization: Secondary | ICD-10-CM

## 2022-11-09 NOTE — Progress Notes (Signed)
Per orders of Allie Bossier NP, injection of shingles vaccine given by Emelia Salisbury. Patient tolerated injection well.

## 2022-12-16 ENCOUNTER — Other Ambulatory Visit: Payer: Self-pay | Admitting: Primary Care

## 2022-12-16 ENCOUNTER — Other Ambulatory Visit: Payer: Self-pay

## 2022-12-16 DIAGNOSIS — E559 Vitamin D deficiency, unspecified: Secondary | ICD-10-CM

## 2022-12-16 MED ORDER — VITAMIN D3 25 MCG (1000 UNIT) PO TABS
1000.0000 [IU] | ORAL_TABLET | Freq: Every day | ORAL | 1 refills | Status: AC
  Filled 2022-12-16: qty 90, 90d supply, fill #0

## 2022-12-16 NOTE — Telephone Encounter (Signed)
From: Rayetta Pigg To: Office of Doreene Nest, NP Sent: 12/16/2022 7:21 AM EDT Subject: Medication Renewal Request  Refills have been requested for the following medications:   cholecalciferol (VITAMIN D) 25 MCG (1000 UNIT) tablet  Preferred pharmacy: [facility] Delivery method: Daryll Drown

## 2023-02-15 ENCOUNTER — Other Ambulatory Visit: Payer: Self-pay

## 2023-02-16 ENCOUNTER — Other Ambulatory Visit: Payer: Self-pay

## 2023-03-09 ENCOUNTER — Other Ambulatory Visit: Payer: Self-pay

## 2023-03-13 ENCOUNTER — Telehealth: Payer: 59 | Admitting: Family Medicine

## 2023-03-13 DIAGNOSIS — H10023 Other mucopurulent conjunctivitis, bilateral: Secondary | ICD-10-CM

## 2023-03-13 NOTE — Progress Notes (Signed)
Because you are outside of [place] our providers are unable to provide treatment and order you the medications you need. You condition will require you to be seen at a local area where you are staying.    NOTE: There will be NO CHARGE for this eVisit   If you are having a true medical emergency please call 911.

## 2023-03-19 IMAGING — MG MM DIGITAL SCREENING BILAT W/ TOMO AND CAD
8 series · 8 of 24 positions shown · non-contrast
Comparison: Previous exam(s).

CLINICAL DATA: Screening.

EXAM:
DIGITAL SCREENING BILATERAL MAMMOGRAM WITH TOMOSYNTHESIS AND CAD
TECHNIQUE: Bilateral screening digital craniocaudal and mediolateral oblique
mammograms were obtained. Bilateral screening digital breast
tomosynthesis was performed. The images were evaluated with
computer-aided detection.

[L CC synth-2D]
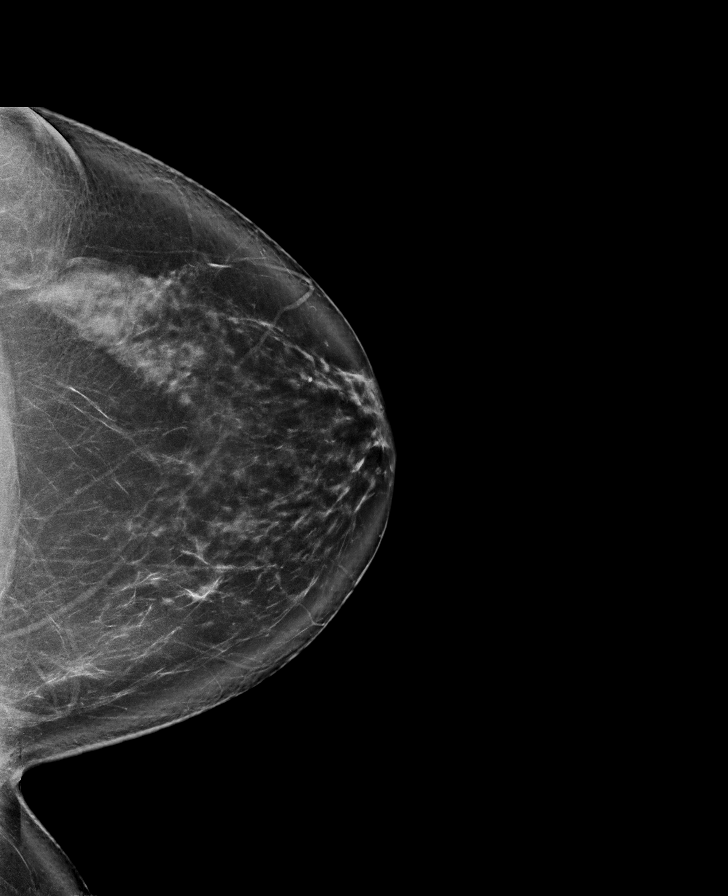

[R MLO synth-2D]
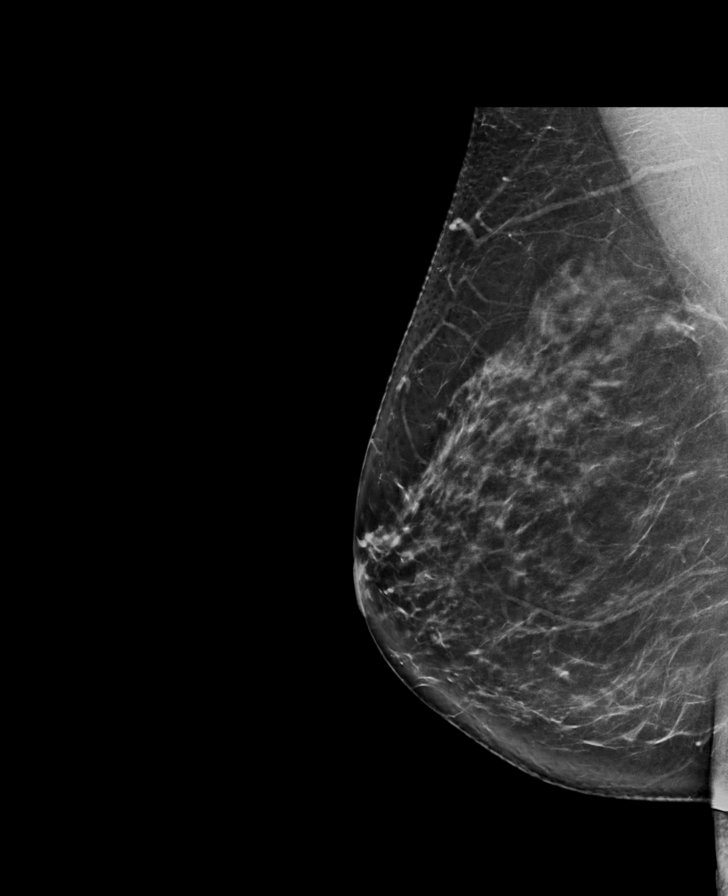

[R CC synth-2D]
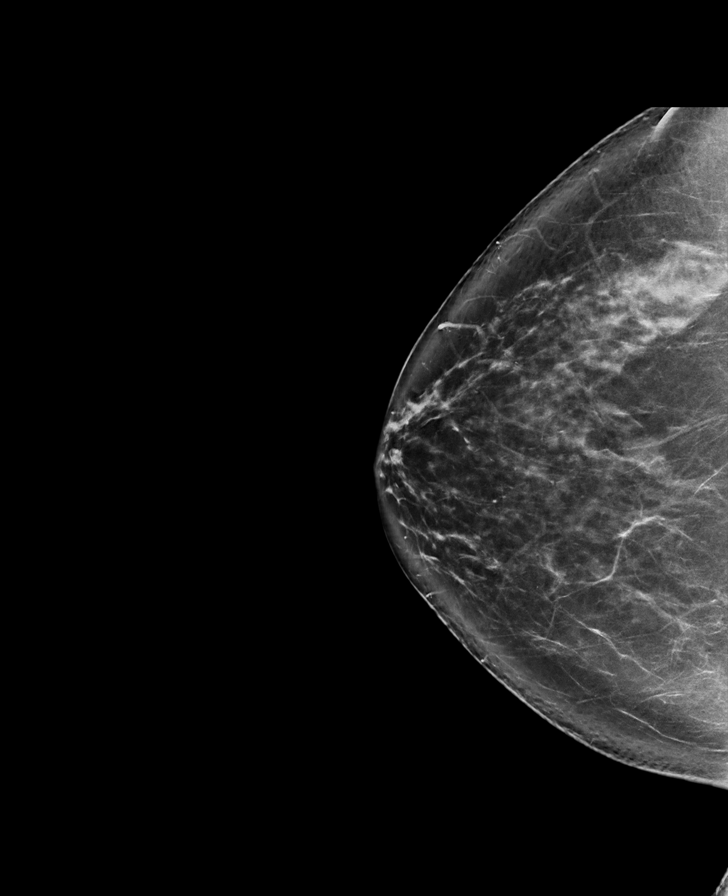

[L MLO synth-2D]
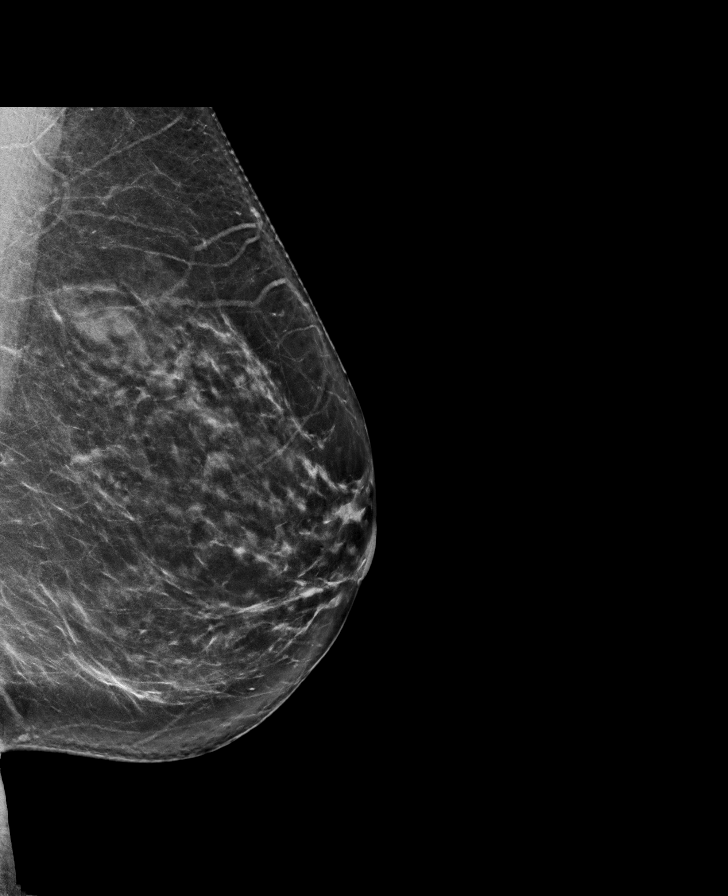

[R CC tomo · tomo slice 46/91.0]
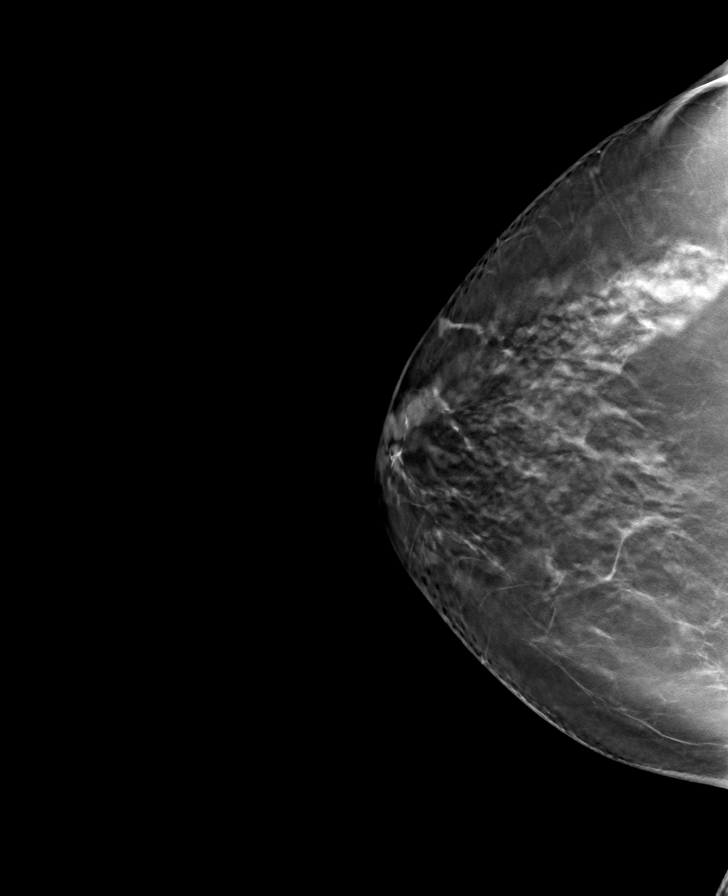

[L MLO tomo · tomo slice 41/81.0]
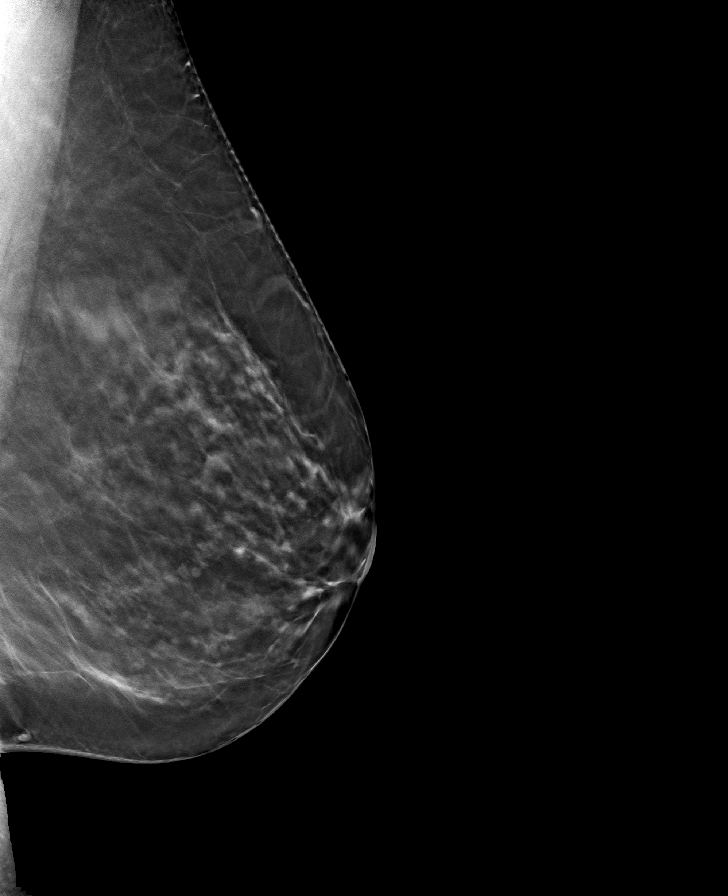

[R MLO tomo · tomo slice 44/87.0]
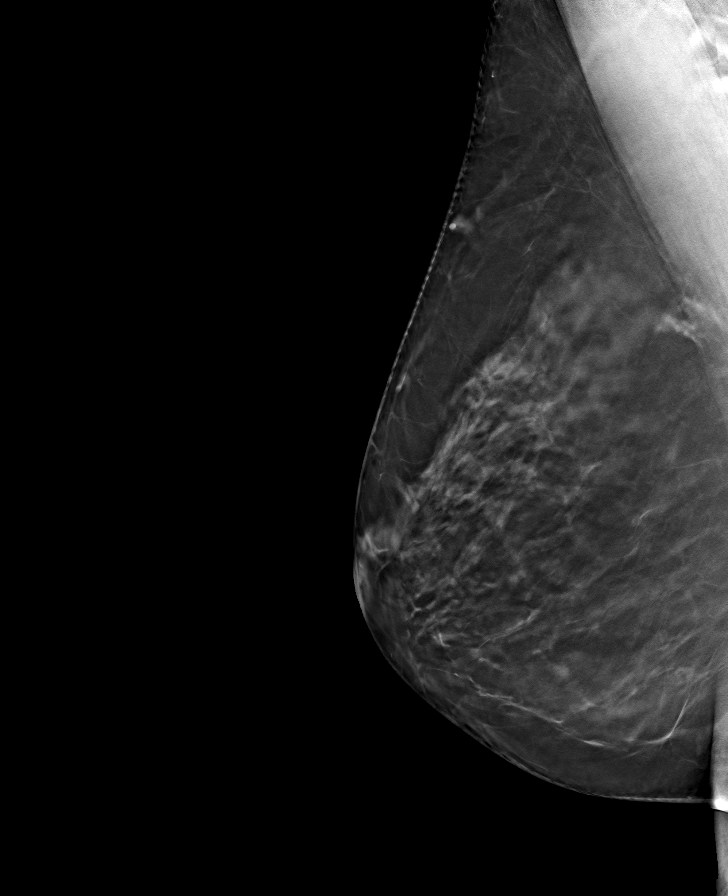

[L CC tomo · tomo slice 49/96.0]
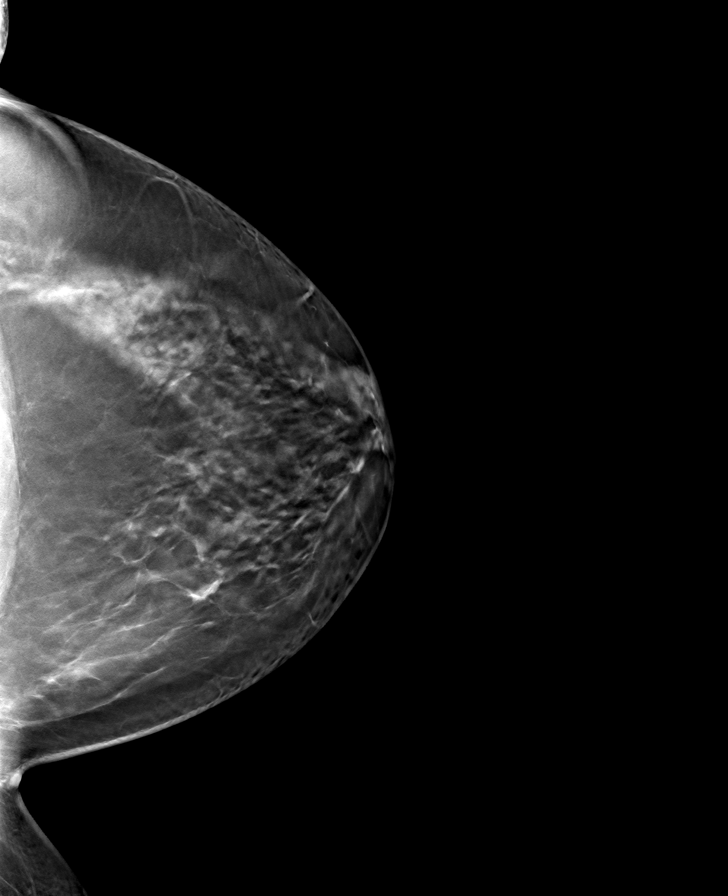

[8 of 24 positions shown; findings below may reference images not displayed]

ACR Breast Density Category b: There are scattered areas of
fibroglandular density.
FINDINGS: There are no findings suspicious for malignancy.
IMPRESSION: No mammographic evidence of malignancy. A result letter of this
screening mammogram will be mailed directly to the patient.

RECOMMENDATION:
Screening mammogram in one year. (Code:51-O-LD2)

BI-RADS CATEGORY  1: Negative.

## 2023-03-22 ENCOUNTER — Other Ambulatory Visit: Payer: Self-pay

## 2023-05-27 ENCOUNTER — Telehealth: Payer: 59 | Admitting: Family Medicine

## 2023-05-27 DIAGNOSIS — J208 Acute bronchitis due to other specified organisms: Secondary | ICD-10-CM

## 2023-05-27 DIAGNOSIS — B9689 Other specified bacterial agents as the cause of diseases classified elsewhere: Secondary | ICD-10-CM

## 2023-05-27 MED ORDER — AZITHROMYCIN 250 MG PO TABS: ORAL_TABLET | ORAL | 0 refills | Status: AC

## 2023-05-27 MED ORDER — BENZONATATE 200 MG PO CAPS: 200.0000 mg | ORAL_CAPSULE | Freq: Two times a day (BID) | ORAL | 0 refills | Status: DC | PRN

## 2023-05-27 NOTE — Progress Notes (Signed)

## 2023-08-14 ENCOUNTER — Other Ambulatory Visit: Payer: Self-pay | Admitting: Obstetrics and Gynecology

## 2023-08-14 DIAGNOSIS — Z1231 Encounter for screening mammogram for malignant neoplasm of breast: Secondary | ICD-10-CM

## 2023-08-15 ENCOUNTER — Encounter: Payer: Self-pay | Admitting: Primary Care

## 2023-08-15 ENCOUNTER — Ambulatory Visit: Payer: 59 | Admitting: Primary Care

## 2023-08-15 VITALS — BP 122/78 | HR 64 | Temp 97.5°F | Ht 64.25 in | Wt 188.0 lb

## 2023-08-15 DIAGNOSIS — Z0000 Encounter for general adult medical examination without abnormal findings: Secondary | ICD-10-CM | POA: Diagnosis not present

## 2023-08-15 DIAGNOSIS — E785 Hyperlipidemia, unspecified: Secondary | ICD-10-CM | POA: Diagnosis not present

## 2023-08-15 DIAGNOSIS — N393 Stress incontinence (female) (male): Secondary | ICD-10-CM | POA: Diagnosis not present

## 2023-08-15 LAB — COMPREHENSIVE METABOLIC PANEL
ALT: 10 U/L (ref 0–35)
AST: 11 U/L (ref 0–37)
Albumin: 4.3 g/dL (ref 3.5–5.2)
Alkaline Phosphatase: 62 U/L (ref 39–117)
BUN: 11 mg/dL (ref 6–23)
CO2: 30 meq/L (ref 19–32)
Calcium: 9.1 mg/dL (ref 8.4–10.5)
Chloride: 105 meq/L (ref 96–112)
Creatinine, Ser: 0.76 mg/dL (ref 0.40–1.20)
GFR: 90.42 mL/min (ref >60.00)
Glucose, Bld: 101 mg/dL — ABNORMAL HIGH (ref 70–99)
Potassium: 3.8 meq/L (ref 3.5–5.1)
Sodium: 140 meq/L (ref 135–145)
Total Bilirubin: 0.5 mg/dL (ref 0.2–1.2)
Total Protein: 6.9 g/dL (ref 6.0–8.3)

## 2023-08-15 LAB — CBC
HCT: 42.6 % (ref 36.0–46.0)
Hemoglobin: 14.3 g/dL (ref 12.0–15.0)
MCHC: 33.5 g/dL (ref 30.0–36.0)
MCV: 86.5 fL (ref 78.0–100.0)
Platelets: 228 10*3/uL (ref 150.0–400.0)
RBC: 4.93 Mil/uL (ref 3.87–5.11)
RDW: 12.5 % (ref 11.5–15.5)
WBC: 6.3 10*3/uL (ref 4.0–10.5)

## 2023-08-15 LAB — LIPID PANEL
Cholesterol: 214 mg/dL — ABNORMAL HIGH (ref 0–200)
HDL: 64.3 mg/dL (ref >39.00)
LDL Cholesterol: 135 mg/dL — ABNORMAL HIGH (ref 0–99)
NonHDL: 150.08
Total CHOL/HDL Ratio: 3
Triglycerides: 74 mg/dL (ref 0.0–149.0)
VLDL: 14.8 mg/dL (ref 0.0–40.0)

## 2023-08-15 NOTE — Assessment & Plan Note (Signed)
Repeat lipid panel pending. Commended her on joining the gym, encouraged regular exercise and healthy diet.

## 2023-08-15 NOTE — Progress Notes (Signed)
Subjective:    Patient ID: Angela Snow, female    DOB: 05/04/1971, 52 y.o.   MRN: [id number]  HPI  Angela Snow is a very pleasant 53 y.o. female who presents today for complete physical and follow up of chronic conditions.  Immunizations: -Tetanus: Completed in 2017 -Influenza: Completed this season  -Shingles: Completed Shingrix series  Diet: Fair diet.  Exercise: Exercising 1-2 times weekly   Eye exam: Completes annually  Dental exam: Completes semi-annually    Pap Smear: UTD, follows with GYN Mammogram: Completed in January 2024, scheduled for January 2025  Colonoscopy: Completed in 2020, due 2025  BP Readings from Last 3 Encounters:  08/15/23 122/78  08/09/22 122/82  08/03/21 108/80       Review of Systems  Constitutional:  Negative for unexpected weight change.  HENT:  Negative for rhinorrhea.   Respiratory:  Negative for cough and shortness of breath.   Cardiovascular:  Negative for chest pain.  Gastrointestinal:  Negative for constipation and diarrhea.  Genitourinary:  Negative for difficulty urinating.  Musculoskeletal:  Negative for arthralgias and myalgias.  Skin:  Negative for rash.  Allergic/Immunologic: Negative for environmental allergies.  Neurological:  Negative for dizziness, numbness and headaches.  Psychiatric/Behavioral:  The patient is not nervous/anxious.          Past Medical History:  Diagnosis Date   History of UTI    Persistent cough for 3 weeks or longer 09/24/2021   TIA (transient ischemic attack)    atrial septal defect    Social History   Socioeconomic History   Marital status: Married    Spouse name: Not on file   Number of children: Not on file   Years of education: Not on file   Highest education level: Not on file  Occupational History   Not on file  Tobacco Use   Smoking status: Former    Current packs/day: 0.00    Types: Cigarettes    Start date: 2000    Quit date: 2010    Years since quitting: 14.9    Smokeless tobacco: Never  Vaping Use   Vaping status: Never Used  Substance and Sexual Activity   Alcohol use: No    Alcohol/week: 0.0 standard drinks of alcohol   Drug use: Never   Sexual activity: Not on file  Other Topics Concern   Not on file  Social History Narrative   Married.   2 children.   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   Enjoys spending time with family.   Social Determinants of Health   Financial Resource Strain: Not on file  Food Insecurity: Not on file  Transportation Needs: Not on file  Physical Activity: Not on file  Stress: Not on file  Social Connections: Not on file  Intimate Partner Violence: Not on file    Past Surgical History:  Procedure Laterality Date   ASD REPAIR     BLADDER SURGERY  1976   KIDNEY SURGERY  1976   PATENT FORAMEN OVALE CLOSURE     POLYPECTOMY  2016   uterine    Family History  Problem Relation Age of Onset   Hyperlipidemia Mother    Hypertension Father    Hyperlipidemia Father    Stroke Father 81   Colon cancer Maternal Grandfather    Heart attack Maternal Grandfather    Heart attack Paternal Grandfather    Heart attack Paternal Aunt    Breast cancer Neg Hx  Colon polyps Neg Hx    Esophageal cancer Neg Hx    Rectal cancer Neg Hx    Stomach cancer Neg Hx     Allergies  Allergen Reactions   Penicillins Rash    Current Outpatient Medications on File Prior to Visit  Medication Sig Dispense Refill   cholecalciferol 25 MCG (1000 UT) tablet Take 1 tablet (1,000 Units total) by mouth daily. 90 tablet 1   estradiol-norethindrone (ACTIVELLA) 1-0.5 MG tablet Take 1 tablet by mouth daily. 84 tablet 4   No current facility-administered medications on file prior to visit.    BP 122/78   Pulse 64   Temp (!) 97.5 F (36.4 C) (Temporal)   Ht 5' 4.25" (1.632 m)   Wt 188 lb (85.3 kg)   LMP 06/20/2017   SpO2 97%   BMI 32.02 kg/m  Objective:   Physical Exam HENT:     Right Ear:  Tympanic membrane and ear canal normal.     Left Ear: Tympanic membrane and ear canal normal.  Eyes:     Pupils: Pupils are equal, round, and reactive to light.  Cardiovascular:     Rate and Rhythm: Normal rate and regular rhythm.  Pulmonary:     Effort: Pulmonary effort is normal.     Breath sounds: Normal breath sounds.  Abdominal:     General: Bowel sounds are normal.     Palpations: Abdomen is soft.     Tenderness: There is no abdominal tenderness.  Musculoskeletal:        General: Normal range of motion.     Cervical back: Neck supple.  Skin:    General: Skin is warm and dry.  Neurological:     Mental Status: She is alert and oriented to person, place, and time.     Cranial Nerves: No cranial nerve deficit.     Deep Tendon Reflexes:     Reflex Scores:      Patellar reflexes are 2+ on the right side and 2+ on the left side. Psychiatric:        Mood and Affect: Mood normal.           Assessment & Plan:  Preventative health care Assessment & Plan: Immunizations UTD. Pap smear UTD. Follows with GYN Mammogram UTD, scheduled.  Colonoscopy UTD, due December 2025  Discussed the importance of a healthy diet and regular exercise in order for weight loss, and to reduce the risk of further co-morbidity.  Exam stable. Labs pending.  Follow up in 1 year for repeat physical.    Hyperlipidemia, unspecified hyperlipidemia type Assessment & Plan: Repeat lipid panel pending. Commended her on joining the gym, encouraged regular exercise and healthy diet.  Orders: -     Lipid panel -     Comprehensive metabolic panel -     CBC  Urine, incontinence, stress female Assessment & Plan: Stable. No concerns today. Continue to monitor.         Doreene Nest, NP

## 2023-08-15 NOTE — Assessment & Plan Note (Signed)
Stable.  No concerns today. Continue to monitor.  

## 2023-08-15 NOTE — Assessment & Plan Note (Signed)
Immunizations UTD. Pap smear UTD. Follows with GYN Mammogram UTD, scheduled.  Colonoscopy UTD, due December 2025  Discussed the importance of a healthy diet and regular exercise in order for weight loss, and to reduce the risk of further co-morbidity.  Exam stable. Labs pending.  Follow up in 1 year for repeat physical.

## 2023-08-15 NOTE — Patient Instructions (Signed)
Stop by the lab prior to leaving today. I will notify you of your results once received.   It was a pleasure to see you today!  

## 2023-09-11 ENCOUNTER — Other Ambulatory Visit: Payer: Self-pay

## 2023-09-12 ENCOUNTER — Other Ambulatory Visit: Payer: Self-pay

## 2023-09-19 ENCOUNTER — Ambulatory Visit: Payer: 59

## 2023-09-19 ENCOUNTER — Other Ambulatory Visit: Payer: Self-pay

## 2023-09-19 ENCOUNTER — Ambulatory Visit
Admission: RE | Admit: 2023-09-19 | Discharge: 2023-09-19 | Disposition: A | Discharge status: Home / Self Care | Payer: 59 | Source: Ambulatory Visit | Attending: Obstetrics and Gynecology | Admitting: Obstetrics and Gynecology

## 2023-09-19 DIAGNOSIS — Z01411 Encounter for gynecological examination (general) (routine) with abnormal findings: Secondary | ICD-10-CM | POA: Diagnosis not present

## 2023-09-19 DIAGNOSIS — Z1389 Encounter for screening for other disorder: Secondary | ICD-10-CM | POA: Diagnosis not present

## 2023-09-19 DIAGNOSIS — Z01419 Encounter for gynecological examination (general) (routine) without abnormal findings: Secondary | ICD-10-CM | POA: Diagnosis not present

## 2023-09-19 DIAGNOSIS — Z1231 Encounter for screening mammogram for malignant neoplasm of breast: Secondary | ICD-10-CM | POA: Diagnosis not present

## 2023-09-19 DIAGNOSIS — Z130 Encounter for screening for diseases of the blood and blood-forming organs and certain disorders involving the immune mechanism: Secondary | ICD-10-CM | POA: Diagnosis not present

## 2023-09-19 DIAGNOSIS — N951 Menopausal and female climacteric states: Secondary | ICD-10-CM | POA: Diagnosis not present

## 2023-09-19 MED ORDER — ESTRADIOL-NORETHINDRONE ACET 1-0.5 MG PO TABS
1.0000 | ORAL_TABLET | Freq: Every day | ORAL | 4 refills | Status: AC
  Filled 2023-09-19: qty 84, 84d supply, fill #0
  Filled 2023-12-10: qty 84, 84d supply, fill #1
  Filled 2024-03-03: qty 84, 84d supply, fill #2
  Filled 2024-05-26: qty 84, 84d supply, fill #3
  Filled 2024-08-24: qty 84, 84d supply, fill #4

## 2023-11-07 ENCOUNTER — Telehealth: Payer: 59 | Admitting: Physician Assistant

## 2023-11-07 DIAGNOSIS — R3989 Other symptoms and signs involving the genitourinary system: Secondary | ICD-10-CM

## 2023-11-07 MED ORDER — SULFAMETHOXAZOLE-TRIMETHOPRIM 800-160 MG PO TABS: 1.0000 | ORAL_TABLET | Freq: Two times a day (BID) | ORAL | 0 refills | Status: AC

## 2023-11-07 NOTE — Progress Notes (Signed)
 I have spent 5 minutes in review of e-visit questionnaire, review and updating patient chart, medical decision making and response to patient.   Piedad Climes, PA-C

## 2023-11-07 NOTE — Progress Notes (Signed)

## 2023-12-11 ENCOUNTER — Other Ambulatory Visit: Payer: Self-pay

## 2023-12-11 ENCOUNTER — Other Ambulatory Visit (HOSPITAL_COMMUNITY): Payer: Self-pay

## 2023-12-11 ENCOUNTER — Encounter: Payer: Self-pay | Admitting: Pharmacist

## 2023-12-27 NOTE — Telephone Encounter (Signed)
 Noted and agree with recommendations for evaluation. Hopefully, she will change her mind.

## 2023-12-27 NOTE — Telephone Encounter (Signed)
 I spoke with pt; pt said last night she was awakened by a pressure feeling in abd that radiated to rt flank area.some nausea but no vomiting, no diarrhea, no fever, no burning, pain or frequency of urine.. pt said right now eating some peanut butter crackers and no pain or pressure and no nausea. Pt said never had pain like this before and concerned could be appendicitis or Kidney stone. Pt hs never had problem with either. Offered pt appt this afternoon and  pt said since not hurting now pt does not want to schedule appt. UC & ED precautions given and pt voiced understanding. Sending note to Tresea Frost NP.

## 2024-02-13 ENCOUNTER — Other Ambulatory Visit: Payer: Self-pay

## 2024-08-04 ENCOUNTER — Telehealth: Admitting: Nurse Practitioner

## 2024-08-04 DIAGNOSIS — J40 Bronchitis, not specified as acute or chronic: Secondary | ICD-10-CM | POA: Diagnosis not present

## 2024-08-04 MED ORDER — BENZONATATE 200 MG PO CAPS: 200.0000 mg | ORAL_CAPSULE | Freq: Two times a day (BID) | ORAL | 0 refills | Status: AC | PRN

## 2024-08-04 MED ORDER — AZITHROMYCIN 250 MG PO TABS: ORAL_TABLET | ORAL | 0 refills | Status: AC

## 2024-08-04 NOTE — Progress Notes (Signed)
 We are sorry that you are not feeling well.  Here is how we plan to help!  Based on your presentation I believe you most likely have bronchitis    In addition you may use A prescription cough medication called Tessalon  Perles 100mg . You may take 1-2 capsules every 8 hours as needed for your cough. I have also prescribed azithromycin   From your responses in the eVisit questionnaire you describe inflammation in the upper respiratory tract which is causing a significant cough.  This is commonly called Bronchitis and has four common causes:   Allergies Viral Infections Acid Reflux Bacterial Infection Allergies, viruses and acid reflux are treated by controlling symptoms or eliminating the cause. An example might be a cough caused by taking certain blood pressure medications. You stop the cough by changing the medication. Another example might be a cough caused by acid reflux. Controlling the reflux helps control the cough.  USE OF BRONCHODILATOR (RESCUE) INHALERS: There is a risk from using your bronchodilator too frequently.  The risk is that over-reliance on a medication which only relaxes the muscles surrounding the breathing tubes can reduce the effectiveness of medications prescribed to reduce swelling and congestion of the tubes themselves.  Although you feel brief relief from the bronchodilator inhaler, your asthma may actually be worsening with the tubes becoming more swollen and filled with mucus.  This can delay other crucial treatments, such as oral steroid medications. If you need to use a bronchodilator inhaler daily, several times per day, you should discuss this with your provider.  There are probably better treatments that could be used to keep your asthma under control.     HOME CARE Only take medications as instructed by your medical team. Complete the entire course of an antibiotic. Drink plenty of fluids and get plenty of rest. Avoid close contacts especially the very young and  the elderly Cover your mouth if you cough or cough into your sleeve. Always remember to wash your hands A steam or ultrasonic humidifier can help congestion.   GET HELP RIGHT AWAY IF: You develop worsening fever. You become short of breath You cough up blood. Your symptoms persist after you have completed your treatment plan MAKE SURE YOU  Understand these instructions. Will watch your condition. Will get help right away if you are not doing well or get worse.  Your e-visit answers were reviewed by a board certified advanced clinical practitioner to complete your personal care plan.  Depending on the condition, your plan could have included both over the counter or prescription medications. If there is a problem please reply  once you have received a response from your provider. Your safety is important to us .  If you have drug allergies check your prescription carefully.    You can use MyChart to ask questions about today's visit, request a non-urgent call back, or ask for a work or school excuse for 24 hours related to this e-Visit. If it has been greater than 24 hours you will need to follow up with your provider, or enter a new e-Visit to address those concerns. You will get an e-mail in the next two days asking about your experience.  I hope that your e-visit has been valuable and will speed your recovery. Thank you for using e-visits.   I have spent 5 minutes in review of e-visit questionnaire, review and updating patient chart, medical decision making and response to patient.   Jourdan Durbin W Hadia Minier, NP

## 2024-08-13 ENCOUNTER — Other Ambulatory Visit: Payer: Self-pay | Admitting: Obstetrics and Gynecology

## 2024-08-13 DIAGNOSIS — Z1231 Encounter for screening mammogram for malignant neoplasm of breast: Secondary | ICD-10-CM

## 2024-08-15 ENCOUNTER — Encounter: Admitting: Primary Care

## 2024-08-26 ENCOUNTER — Other Ambulatory Visit: Payer: Self-pay

## 2024-08-29 ENCOUNTER — Encounter: Admitting: Primary Care

## 2024-09-27 ENCOUNTER — Ambulatory Visit
Admission: RE | Admit: 2024-09-27 | Discharge: 2024-09-27 | Disposition: A | Discharge status: Home / Self Care | Source: Ambulatory Visit | Attending: Obstetrics and Gynecology | Admitting: Obstetrics and Gynecology

## 2024-09-27 ENCOUNTER — Encounter: Admitting: Primary Care

## 2024-09-27 DIAGNOSIS — Z1231 Encounter for screening mammogram for malignant neoplasm of breast: Secondary | ICD-10-CM

## 2024-10-03 ENCOUNTER — Encounter: Admitting: Primary Care

## 2024-11-22 ENCOUNTER — Encounter: Admitting: Primary Care
